# Patient Record
Sex: Male | Born: 1935 | Race: Black or African American | Hispanic: No | Marital: Married | Smoking: Never smoker
Health system: Southern US, Community
[De-identification: ages and names within clinical notes are randomized; demographics above are authoritative.]

---

## 2003-12-24 ENCOUNTER — Encounter: Payer: Self-pay | Admitting: Physical Medicine and Rehabilitation

## 2004-01-24 ENCOUNTER — Encounter: Payer: Self-pay | Admitting: Physical Medicine and Rehabilitation

## 2004-02-24 ENCOUNTER — Encounter: Payer: Self-pay | Admitting: Physical Medicine and Rehabilitation

## 2004-02-26 ENCOUNTER — Emergency Department: Payer: Self-pay | Admitting: Emergency Medicine

## 2004-03-25 ENCOUNTER — Encounter: Payer: Self-pay | Admitting: Physical Medicine and Rehabilitation

## 2004-04-11 ENCOUNTER — Emergency Department: Payer: Self-pay | Admitting: Emergency Medicine

## 2004-04-25 ENCOUNTER — Encounter: Payer: Self-pay | Admitting: Physical Medicine and Rehabilitation

## 2004-05-04 ENCOUNTER — Emergency Department: Payer: Self-pay | Admitting: General Practice

## 2004-05-23 ENCOUNTER — Encounter: Payer: Self-pay | Admitting: Physical Medicine and Rehabilitation

## 2004-06-07 ENCOUNTER — Emergency Department: Payer: Self-pay | Admitting: General Practice

## 2004-06-23 ENCOUNTER — Encounter: Payer: Self-pay | Admitting: Physical Medicine and Rehabilitation

## 2004-07-23 ENCOUNTER — Encounter: Payer: Self-pay | Admitting: Physical Medicine and Rehabilitation

## 2004-08-23 ENCOUNTER — Encounter: Payer: Self-pay | Admitting: Physical Medicine and Rehabilitation

## 2004-09-06 ENCOUNTER — Ambulatory Visit: Payer: Self-pay | Admitting: Specialist

## 2004-09-06 ENCOUNTER — Other Ambulatory Visit: Payer: Self-pay

## 2004-09-18 ENCOUNTER — Ambulatory Visit: Payer: Self-pay | Admitting: Specialist

## 2004-09-22 ENCOUNTER — Encounter: Payer: Self-pay | Admitting: Physical Medicine and Rehabilitation

## 2004-10-23 ENCOUNTER — Encounter: Payer: Self-pay | Admitting: Physical Medicine and Rehabilitation

## 2004-11-23 ENCOUNTER — Encounter: Payer: Self-pay | Admitting: Physical Medicine and Rehabilitation

## 2004-12-23 ENCOUNTER — Encounter: Payer: Self-pay | Admitting: Physical Medicine and Rehabilitation

## 2005-01-14 ENCOUNTER — Emergency Department: Payer: Self-pay | Admitting: Emergency Medicine

## 2005-01-14 ENCOUNTER — Other Ambulatory Visit: Payer: Self-pay

## 2005-01-23 ENCOUNTER — Encounter: Payer: Self-pay | Admitting: Physical Medicine and Rehabilitation

## 2005-02-15 ENCOUNTER — Emergency Department: Payer: Self-pay | Admitting: Emergency Medicine

## 2005-02-22 ENCOUNTER — Encounter: Payer: Self-pay | Admitting: Physical Medicine and Rehabilitation

## 2005-03-25 ENCOUNTER — Encounter: Payer: Self-pay | Admitting: Physical Medicine and Rehabilitation

## 2005-04-25 ENCOUNTER — Encounter: Payer: Self-pay | Admitting: Physical Medicine and Rehabilitation

## 2005-05-23 ENCOUNTER — Encounter: Payer: Self-pay | Admitting: Physical Medicine and Rehabilitation

## 2005-06-23 ENCOUNTER — Encounter: Payer: Self-pay | Admitting: Physical Medicine and Rehabilitation

## 2005-07-23 ENCOUNTER — Encounter: Payer: Self-pay | Admitting: Physical Medicine and Rehabilitation

## 2005-08-23 ENCOUNTER — Encounter: Payer: Self-pay | Admitting: Physical Medicine and Rehabilitation

## 2005-09-22 ENCOUNTER — Encounter: Payer: Self-pay | Admitting: Physical Medicine and Rehabilitation

## 2005-10-23 ENCOUNTER — Encounter: Payer: Self-pay | Admitting: Physical Medicine and Rehabilitation

## 2005-11-23 ENCOUNTER — Encounter: Payer: Self-pay | Admitting: Physical Medicine and Rehabilitation

## 2005-12-23 ENCOUNTER — Encounter: Payer: Self-pay | Admitting: Physical Medicine and Rehabilitation

## 2006-08-13 ENCOUNTER — Encounter: Payer: Self-pay | Admitting: Internal Medicine

## 2006-08-24 ENCOUNTER — Encounter: Payer: Self-pay | Admitting: Internal Medicine

## 2006-09-23 ENCOUNTER — Encounter: Payer: Self-pay | Admitting: Internal Medicine

## 2006-10-24 ENCOUNTER — Encounter: Payer: Self-pay | Admitting: Internal Medicine

## 2006-11-24 ENCOUNTER — Encounter: Payer: Self-pay | Admitting: Internal Medicine

## 2006-12-24 ENCOUNTER — Encounter: Payer: Self-pay | Admitting: Internal Medicine

## 2007-01-26 ENCOUNTER — Encounter: Payer: Self-pay | Admitting: Internal Medicine

## 2007-02-16 ENCOUNTER — Emergency Department: Payer: Self-pay | Admitting: Emergency Medicine

## 2007-02-23 ENCOUNTER — Encounter: Payer: Self-pay | Admitting: Internal Medicine

## 2007-08-12 ENCOUNTER — Ambulatory Visit: Payer: Self-pay | Admitting: Neurosurgery

## 2007-09-16 ENCOUNTER — Encounter: Payer: Self-pay | Admitting: Neurology

## 2007-09-23 ENCOUNTER — Encounter: Payer: Self-pay | Admitting: Neurology

## 2007-10-24 ENCOUNTER — Encounter: Payer: Self-pay | Admitting: Neurology

## 2008-05-25 ENCOUNTER — Ambulatory Visit: Payer: Self-pay | Admitting: Pain Medicine

## 2009-07-04 ENCOUNTER — Inpatient Hospital Stay: Payer: Self-pay | Admitting: Internal Medicine

## 2010-05-05 ENCOUNTER — Emergency Department: Payer: Self-pay | Admitting: Emergency Medicine

## 2011-10-17 ENCOUNTER — Inpatient Hospital Stay: Payer: Self-pay | Admitting: Internal Medicine

## 2011-10-17 LAB — COMPREHENSIVE METABOLIC PANEL
Albumin: 3.5 g/dL (ref 3.4–5.0)
BUN: 15 mg/dL (ref 7–18)
EGFR (Non-African Amer.): 49 — ABNORMAL LOW
Glucose: 116 mg/dL — ABNORMAL HIGH (ref 65–99)
Osmolality: 272 (ref 275–301)
SGOT(AST): 48 U/L — ABNORMAL HIGH (ref 15–37)
SGPT (ALT): 33 U/L
Total Protein: 6.8 g/dL (ref 6.4–8.2)

## 2011-10-17 LAB — CBC
HCT: 41.5 % (ref 40.0–52.0)
MCHC: 32.8 g/dL (ref 32.0–36.0)
Platelet: 100 10*3/uL — ABNORMAL LOW (ref 150–440)
RDW: 14.9 % — ABNORMAL HIGH (ref 11.5–14.5)
WBC: 4.6 10*3/uL (ref 3.8–10.6)

## 2011-10-17 LAB — URINALYSIS, COMPLETE
Glucose,UR: NEGATIVE mg/dL (ref 0–75)
Nitrite: NEGATIVE
Ph: 7 (ref 4.5–8.0)
Protein: NEGATIVE
RBC,UR: 4 /HPF (ref 0–5)
Squamous Epithelial: NONE SEEN
WBC UR: 24 /HPF (ref 0–5)

## 2011-10-17 LAB — PROTIME-INR
INR: 5.9
Prothrombin Time: 52.2 secs — ABNORMAL HIGH (ref 11.5–14.7)

## 2011-10-17 LAB — TROPONIN I: Troponin-I: 0.04 ng/mL

## 2011-10-18 DIAGNOSIS — I059 Rheumatic mitral valve disease, unspecified: Secondary | ICD-10-CM

## 2011-10-18 LAB — PROTIME-INR: Prothrombin Time: 42.2 secs — ABNORMAL HIGH (ref 11.5–14.7)

## 2011-10-18 LAB — CBC WITH DIFFERENTIAL/PLATELET
Basophil #: 0 x10 3/mm 3
Basophil %: 0.8 %
Eosinophil #: 0.2 x10 3/mm 3
Eosinophil %: 4 %
HCT: 44.8 %
HGB: 14.8 g/dL
Lymphocyte %: 20.6 %
Lymphs Abs: 0.9 x10 3/mm 3 — ABNORMAL LOW
MCH: 32.2 pg
MCHC: 33 g/dL
MCV: 97 fL
Monocyte #: 0.5 "x10 3/mm "
Monocyte %: 10.6 %
Neutrophil #: 2.8 x10 3/mm 3
Neutrophil %: 64 %
Platelet: 113 x10 3/mm 3 — ABNORMAL LOW
RBC: 4.6 x10 6/mm 3
RDW: 15.3 % — ABNORMAL HIGH
WBC: 4.4 x10 3/mm 3

## 2011-10-18 LAB — COMPREHENSIVE METABOLIC PANEL
Albumin: 3.5 g/dL (ref 3.4–5.0)
BUN: 14 mg/dL (ref 7–18)
Bilirubin,Total: 2.1 mg/dL — ABNORMAL HIGH (ref 0.2–1.0)
Calcium, Total: 9.3 mg/dL (ref 8.5–10.1)
Chloride: 102 mmol/L (ref 98–107)
EGFR (African American): >60
Glucose: 89 mg/dL (ref 65–99)
SGOT(AST): 50 U/L — ABNORMAL HIGH (ref 15–37)
SGPT (ALT): 34 U/L
Total Protein: 7 g/dL (ref 6.4–8.2)

## 2011-10-18 LAB — CK: CK, Total: 221 U/L

## 2011-10-18 LAB — LIPID PANEL
Cholesterol: 117 mg/dL
HDL Cholesterol: 51 mg/dL
Ldl Cholesterol, Calc: 54 mg/dL
Triglycerides: 61 mg/dL
VLDL Cholesterol, Calc: 12 mg/dL

## 2011-10-18 LAB — HEMOGLOBIN A1C: Hemoglobin A1C: 5 % (ref 4.2–6.3)

## 2011-10-18 LAB — TROPONIN I
Troponin-I: <0.02 ng/mL
Troponin-I: 0.04 ng/mL

## 2011-10-18 LAB — MAGNESIUM: Magnesium: 2.1 mg/dL

## 2011-10-18 LAB — CK-MB
CK-MB: 3.8 ng/mL — ABNORMAL HIGH (ref 0.5–3.6)
CK-MB: 4.1 ng/mL — ABNORMAL HIGH (ref 0.5–3.6)

## 2011-10-20 LAB — PROTIME-INR
INR: 2.8
Prothrombin Time: 29.9 secs — ABNORMAL HIGH (ref 11.5–14.7)

## 2011-10-21 LAB — PROTIME-INR
INR: 1.9
Prothrombin Time: 21.7 secs — ABNORMAL HIGH (ref 11.5–14.7)

## 2011-10-21 LAB — URINE CULTURE

## 2011-12-04 ENCOUNTER — Ambulatory Visit: Payer: Self-pay | Admitting: Ophthalmology

## 2011-12-04 DIAGNOSIS — I10 Essential (primary) hypertension: Secondary | ICD-10-CM

## 2011-12-04 LAB — PROTIME-INR
INR: 1.5
Prothrombin Time: 18.9 secs — ABNORMAL HIGH (ref 11.5–14.7)

## 2011-12-04 LAB — POTASSIUM: Potassium: 5.4 mmol/L — ABNORMAL HIGH (ref 3.5–5.1)

## 2011-12-16 ENCOUNTER — Ambulatory Visit: Payer: Self-pay | Admitting: Ophthalmology

## 2011-12-16 LAB — POTASSIUM: Potassium: 5.2 mmol/L — ABNORMAL HIGH (ref 3.5–5.1)

## 2013-09-28 ENCOUNTER — Inpatient Hospital Stay: Payer: Self-pay | Admitting: Internal Medicine

## 2013-09-28 LAB — BASIC METABOLIC PANEL
Anion Gap: 8 (ref 7–16)
BUN: 43 mg/dL — ABNORMAL HIGH (ref 7–18)
CO2: 23 mmol/L (ref 21–32)
CREATININE: 2.11 mg/dL — AB (ref 0.60–1.30)
Calcium, Total: 9.1 mg/dL (ref 8.5–10.1)
Chloride: 89 mmol/L — ABNORMAL LOW (ref 98–107)
EGFR (African American): 33 — ABNORMAL LOW
GFR CALC NON AF AMER: 28 — AB
Glucose: 103 mg/dL — ABNORMAL HIGH (ref 65–99)
OSMOLALITY: 253 (ref 275–301)
Potassium: 4.8 mmol/L (ref 3.5–5.1)
Sodium: 120 mmol/L — CL (ref 136–145)

## 2013-09-28 LAB — CBC WITH DIFFERENTIAL/PLATELET
BASOS ABS: 0 10*3/uL (ref 0.0–0.1)
BASOS PCT: 0.8 %
EOS ABS: 0.7 10*3/uL (ref 0.0–0.7)
EOS PCT: 13.2 %
HCT: 42.2 % (ref 40.0–52.0)
HGB: 13.7 g/dL (ref 13.0–18.0)
Lymphocyte #: 0.9 10*3/uL — ABNORMAL LOW (ref 1.0–3.6)
Lymphocyte %: 17.4 %
MCH: 33.4 pg (ref 26.0–34.0)
MCHC: 32.4 g/dL (ref 32.0–36.0)
MCV: 103 fL — ABNORMAL HIGH (ref 80–100)
MONOS PCT: 10.2 %
Monocyte #: 0.5 x10 3/mm (ref 0.2–1.0)
Neutrophil #: 2.9 10*3/uL (ref 1.4–6.5)
Neutrophil %: 58.4 %
Platelet: 139 10*3/uL — ABNORMAL LOW (ref 150–440)
RBC: 4.09 10*6/uL — ABNORMAL LOW (ref 4.40–5.90)
RDW: 13.2 % (ref 11.5–14.5)
WBC: 5 10*3/uL (ref 3.8–10.6)

## 2013-09-28 LAB — PROTIME-INR
INR: 2.6
Prothrombin Time: 27.2 secs — ABNORMAL HIGH (ref 11.5–14.7)

## 2013-09-28 LAB — TSH: THYROID STIMULATING HORM: 0.731 u[IU]/mL

## 2013-09-29 LAB — BASIC METABOLIC PANEL
Anion Gap: 10 (ref 7–16)
BUN: 44 mg/dL — AB (ref 7–18)
CREATININE: 1.7 mg/dL — AB (ref 0.60–1.30)
Calcium, Total: 8.6 mg/dL (ref 8.5–10.1)
Chloride: 93 mmol/L — ABNORMAL LOW (ref 98–107)
Co2: 23 mmol/L (ref 21–32)
EGFR (African American): 43 — ABNORMAL LOW
GFR CALC NON AF AMER: 37 — AB
GLUCOSE: 72 mg/dL (ref 65–99)
OSMOLALITY: 263 (ref 275–301)
POTASSIUM: 4.5 mmol/L (ref 3.5–5.1)
SODIUM: 126 mmol/L — AB (ref 136–145)

## 2013-09-29 LAB — URINALYSIS, COMPLETE
BILIRUBIN, UR: NEGATIVE
Glucose,UR: NEGATIVE mg/dL (ref 0–75)
Ketone: NEGATIVE
NITRITE: POSITIVE
Ph: 7 (ref 4.5–8.0)
Protein: 30
RBC,UR: 11 /HPF (ref 0–5)
SPECIFIC GRAVITY: 1.01 (ref 1.003–1.030)

## 2013-09-29 LAB — PROTEIN, URINE, RANDOM: Protein, Random Urine: 42 mg/dL — ABNORMAL HIGH (ref 0–12)

## 2013-09-29 LAB — SODIUM, URINE, RANDOM: Sodium, Urine Random: 15 mmol/L (ref 20–110)

## 2013-09-29 LAB — CREATININE, URINE, RANDOM: CREATININE, URINE RANDOM: 107.2 mg/dL (ref 30.0–125.0)

## 2013-09-29 LAB — OSMOLALITY, URINE: OSMOLALITY: 373 mosm/kg

## 2013-09-30 LAB — BASIC METABOLIC PANEL
Anion Gap: 9 (ref 7–16)
BUN: 40 mg/dL — AB (ref 7–18)
CO2: 25 mmol/L (ref 21–32)
Calcium, Total: 9.3 mg/dL (ref 8.5–10.1)
Chloride: 92 mmol/L — ABNORMAL LOW (ref 98–107)
Creatinine: 1.8 mg/dL — ABNORMAL HIGH (ref 0.60–1.30)
EGFR (Non-African Amer.): 35 — ABNORMAL LOW
GFR CALC AF AMER: 40 — AB
Glucose: 92 mg/dL (ref 65–99)
Osmolality: 263 (ref 275–301)
Potassium: 4.7 mmol/L (ref 3.5–5.1)
SODIUM: 126 mmol/L — AB (ref 136–145)

## 2013-09-30 LAB — PROTIME-INR
INR: 3.8
PROTHROMBIN TIME: 36.2 s — AB (ref 11.5–14.7)

## 2013-09-30 LAB — CBC WITH DIFFERENTIAL/PLATELET
Basophil #: 0 10*3/uL (ref 0.0–0.1)
Basophil %: 0.9 %
EOS ABS: 0.7 10*3/uL (ref 0.0–0.7)
EOS PCT: 16.8 %
HCT: 40.1 % (ref 40.0–52.0)
HGB: 13.2 g/dL (ref 13.0–18.0)
Lymphocyte #: 0.7 10*3/uL — ABNORMAL LOW (ref 1.0–3.6)
Lymphocyte %: 17.1 %
MCH: 34.8 pg — ABNORMAL HIGH (ref 26.0–34.0)
MCHC: 33 g/dL (ref 32.0–36.0)
MCV: 106 fL — ABNORMAL HIGH (ref 80–100)
Monocyte #: 0.5 x10 3/mm (ref 0.2–1.0)
Monocyte %: 12 %
Neutrophil #: 2.2 10*3/uL (ref 1.4–6.5)
Neutrophil %: 53.2 %
Platelet: 121 10*3/uL — ABNORMAL LOW (ref 150–440)
RBC: 3.8 10*6/uL — ABNORMAL LOW (ref 4.40–5.90)
RDW: 13.1 % (ref 11.5–14.5)
WBC: 4.2 10*3/uL (ref 3.8–10.6)

## 2013-10-01 LAB — PROTIME-INR
INR: 4.3 — AB
Prothrombin Time: 40.1 secs — ABNORMAL HIGH (ref 11.5–14.7)

## 2013-10-01 LAB — URINE CULTURE

## 2013-10-01 LAB — BASIC METABOLIC PANEL
Anion Gap: 7 (ref 7–16)
BUN: 41 mg/dL — ABNORMAL HIGH (ref 7–18)
Calcium, Total: 8.7 mg/dL (ref 8.5–10.1)
Chloride: 96 mmol/L — ABNORMAL LOW (ref 98–107)
Co2: 25 mmol/L (ref 21–32)
Creatinine: 1.49 mg/dL — ABNORMAL HIGH (ref 0.60–1.30)
GFR CALC AF AMER: 50 — AB
GFR CALC NON AF AMER: 43 — AB
Glucose: 102 mg/dL — ABNORMAL HIGH (ref 65–99)
OSMOLALITY: 267 (ref 275–301)
POTASSIUM: 4.7 mmol/L (ref 3.5–5.1)
Sodium: 128 mmol/L — ABNORMAL LOW (ref 136–145)

## 2013-10-02 LAB — BASIC METABOLIC PANEL
ANION GAP: 6 — AB (ref 7–16)
BUN: 33 mg/dL — ABNORMAL HIGH (ref 7–18)
CHLORIDE: 101 mmol/L (ref 98–107)
CO2: 26 mmol/L (ref 21–32)
Calcium, Total: 8.7 mg/dL (ref 8.5–10.1)
Creatinine: 1.33 mg/dL — ABNORMAL HIGH (ref 0.60–1.30)
GFR CALC AF AMER: 58 — AB
GFR CALC NON AF AMER: 50 — AB
GLUCOSE: 83 mg/dL (ref 65–99)
Osmolality: 273 (ref 275–301)
Potassium: 4.9 mmol/L (ref 3.5–5.1)
Sodium: 133 mmol/L — ABNORMAL LOW (ref 136–145)

## 2013-10-02 LAB — PROTIME-INR
INR: 3
Prothrombin Time: 30.3 secs — ABNORMAL HIGH (ref 11.5–14.7)

## 2013-10-03 LAB — PROTIME-INR
INR: 2.6
Prothrombin Time: 27.4 secs — ABNORMAL HIGH (ref 11.5–14.7)

## 2013-10-03 LAB — CBC WITH DIFFERENTIAL/PLATELET
BASOS ABS: 0.1 10*3/uL (ref 0.0–0.1)
BASOS PCT: 1.2 %
EOS ABS: 1.4 10*3/uL — AB (ref 0.0–0.7)
Eosinophil %: 29.3 %
HCT: 41.3 % (ref 40.0–52.0)
HGB: 13.6 g/dL (ref 13.0–18.0)
LYMPHS PCT: 18.3 %
Lymphocyte #: 0.9 10*3/uL — ABNORMAL LOW (ref 1.0–3.6)
MCH: 34.4 pg — AB (ref 26.0–34.0)
MCHC: 32.9 g/dL (ref 32.0–36.0)
MCV: 105 fL — ABNORMAL HIGH (ref 80–100)
Monocyte #: 0.5 x10 3/mm (ref 0.2–1.0)
Monocyte %: 11.2 %
NEUTROS ABS: 1.9 10*3/uL (ref 1.4–6.5)
NEUTROS PCT: 40 %
Platelet: 135 10*3/uL — ABNORMAL LOW (ref 150–440)
RBC: 3.95 10*6/uL — ABNORMAL LOW (ref 4.40–5.90)
RDW: 13.2 % (ref 11.5–14.5)
WBC: 4.8 10*3/uL (ref 3.8–10.6)

## 2013-10-03 LAB — BASIC METABOLIC PANEL
Anion Gap: 5 — ABNORMAL LOW (ref 7–16)
BUN: 22 mg/dL — AB (ref 7–18)
CHLORIDE: 104 mmol/L (ref 98–107)
CREATININE: 1.13 mg/dL (ref 0.60–1.30)
Calcium, Total: 8.7 mg/dL (ref 8.5–10.1)
Co2: 27 mmol/L (ref 21–32)
EGFR (Non-African Amer.): >60
Glucose: 83 mg/dL (ref 65–99)
Osmolality: 274 (ref 275–301)
POTASSIUM: 5.1 mmol/L (ref 3.5–5.1)
Sodium: 136 mmol/L (ref 136–145)

## 2013-10-04 LAB — PROTIME-INR
INR: 2.4
Prothrombin Time: 25.2 secs — ABNORMAL HIGH (ref 11.5–14.7)

## 2013-10-05 LAB — CULTURE, BLOOD (SINGLE)

## 2013-10-05 LAB — PROTIME-INR
INR: 2.6
PROTHROMBIN TIME: 27.5 s — AB (ref 11.5–14.7)

## 2013-10-05 LAB — BETA-HYDROXYBUTYRIC ACID: BETA-HYDROXYBUTYRATE: 1.12 mg/dL (ref 0.2–2.8)

## 2013-10-05 LAB — GLUCOSE, RANDOM: GLUCOSE: 139 mg/dL — AB (ref 65–99)

## 2013-10-06 LAB — PROTIME-INR
INR: 2.9
Prothrombin Time: 29.8 secs — ABNORMAL HIGH (ref 11.5–14.7)

## 2013-10-06 LAB — GLUCOSE, RANDOM: GLUCOSE: 127 mg/dL — AB (ref 65–99)

## 2013-10-06 LAB — BETA-HYDROXYBUTYRIC ACID: BETA-HYDROXYBUTYRATE: 1.13 mg/dL (ref 0.2–2.8)

## 2014-01-02 ENCOUNTER — Inpatient Hospital Stay: Payer: Self-pay | Admitting: Internal Medicine

## 2014-01-02 LAB — URINALYSIS, COMPLETE
Bilirubin,UR: NEGATIVE
Glucose,UR: NEGATIVE mg/dL (ref 0–75)
NITRITE: NEGATIVE
Ph: 5 (ref 4.5–8.0)
RBC,UR: 20 /HPF (ref 0–5)
Specific Gravity: 1.019 (ref 1.003–1.030)
Squamous Epithelial: <1
WBC UR: 58 /HPF (ref 0–5)

## 2014-01-02 LAB — PRO B NATRIURETIC PEPTIDE: B-Type Natriuretic Peptide: 5561 pg/mL — ABNORMAL HIGH (ref 0–450)

## 2014-01-02 LAB — CBC
HCT: 45.8 % (ref 40.0–52.0)
HGB: 14.9 g/dL (ref 13.0–18.0)
MCH: 32.8 pg (ref 26.0–34.0)
MCHC: 32.5 g/dL (ref 32.0–36.0)
MCV: 101 fL — AB (ref 80–100)
PLATELETS: 84 10*3/uL — AB (ref 150–440)
RBC: 4.54 10*6/uL (ref 4.40–5.90)
RDW: 12.9 % (ref 11.5–14.5)
WBC: 3.7 10*3/uL — ABNORMAL LOW (ref 3.8–10.6)

## 2014-01-02 LAB — BASIC METABOLIC PANEL
BUN: 15 mg/dL (ref 7–18)
CHLORIDE: 83 mmol/L — AB (ref 98–107)
Calcium, Total: 9.1 mg/dL (ref 8.5–10.1)
Co2: 25 mmol/L (ref 21–32)
Creatinine: 1.17 mg/dL (ref 0.60–1.30)
EGFR (African American): >60
EGFR (Non-African Amer.): >60
Glucose: 93 mg/dL (ref 65–99)
POTASSIUM: 4.2 mmol/L (ref 3.5–5.1)
Sodium: 119 mmol/L — CL (ref 136–145)

## 2014-01-02 LAB — PROTIME-INR
INR: 3.1
PROTHROMBIN TIME: 31 s — AB (ref 11.5–14.7)

## 2014-01-02 LAB — TROPONIN I: TROPONIN-I: 0.05 ng/mL

## 2014-01-03 LAB — MAGNESIUM: Magnesium: 1.7 mg/dL — ABNORMAL LOW

## 2014-01-03 LAB — BASIC METABOLIC PANEL
ANION GAP: 10 (ref 7–16)
Anion Gap: 11 (ref 7–16)
BUN: 15 mg/dL (ref 7–18)
BUN: 16 mg/dL (ref 7–18)
CHLORIDE: 86 mmol/L — AB (ref 98–107)
CHLORIDE: 88 mmol/L — AB (ref 98–107)
CREATININE: 1 mg/dL (ref 0.60–1.30)
Calcium, Total: 8.8 mg/dL (ref 8.5–10.1)
Calcium, Total: 8.6 mg/dL (ref 8.5–10.1)
Co2: 25 mmol/L (ref 21–32)
Co2: 24 mmol/L (ref 21–32)
Creatinine: 1.06 mg/dL (ref 0.60–1.30)
EGFR (African American): >60
EGFR (Non-African Amer.): >60
EGFR (Non-African Amer.): >60
GLUCOSE: 96 mg/dL (ref 65–99)
GLUCOSE: 100 mg/dL — AB (ref 65–99)
OSMOLALITY: 245 (ref 275–301)
Osmolality: 249 (ref 275–301)
POTASSIUM: 3.7 mmol/L (ref 3.5–5.1)
POTASSIUM: 3.8 mmol/L (ref 3.5–5.1)
SODIUM: 121 mmol/L — AB (ref 136–145)
SODIUM: 123 mmol/L — AB (ref 136–145)

## 2014-01-03 LAB — PROTIME-INR
INR: 3.1
Prothrombin Time: 30.9 secs — ABNORMAL HIGH (ref 11.5–14.7)

## 2014-01-03 LAB — HEPATIC FUNCTION PANEL A (ARMC)
ALK PHOS: 106 U/L
Albumin: 3.4 g/dL (ref 3.4–5.0)
Bilirubin, Direct: 0.7 mg/dL — ABNORMAL HIGH (ref 0.00–0.20)
Bilirubin,Total: 1.5 mg/dL — ABNORMAL HIGH (ref 0.2–1.0)
SGOT(AST): 47 U/L — ABNORMAL HIGH (ref 15–37)
SGPT (ALT): 33 U/L
Total Protein: 6.3 g/dL — ABNORMAL LOW (ref 6.4–8.2)

## 2014-01-03 LAB — CK-MB
CK-MB: 10.2 ng/mL — AB (ref 0.5–3.6)
CK-MB: 10.6 ng/mL — ABNORMAL HIGH (ref 0.5–3.6)
CK-MB: 10.8 ng/mL — ABNORMAL HIGH (ref 0.5–3.6)
CK-MB: 12.1 ng/mL — AB (ref 0.5–3.6)

## 2014-01-03 LAB — TROPONIN I
TROPONIN-I: 0.04 ng/mL
Troponin-I: 0.04 ng/mL

## 2014-01-03 LAB — AMMONIA: AMMONIA, PLASMA: 36 umol/L — AB (ref 11–32)

## 2014-01-03 LAB — CK: CK, Total: 292 U/L

## 2014-01-04 LAB — BASIC METABOLIC PANEL
ANION GAP: 10 (ref 7–16)
Anion Gap: 9 (ref 7–16)
BUN: 16 mg/dL (ref 7–18)
BUN: 17 mg/dL (ref 7–18)
CREATININE: 1.2 mg/dL (ref 0.60–1.30)
Calcium, Total: 9 mg/dL (ref 8.5–10.1)
Calcium, Total: 8.7 mg/dL (ref 8.5–10.1)
Chloride: 87 mmol/L — ABNORMAL LOW (ref 98–107)
Chloride: 89 mmol/L — ABNORMAL LOW (ref 98–107)
Co2: 28 mmol/L (ref 21–32)
Co2: 28 mmol/L (ref 21–32)
Creatinine: 1.13 mg/dL (ref 0.60–1.30)
EGFR (Non-African Amer.): >60
EGFR (Non-African Amer.): >60
Glucose: 102 mg/dL — ABNORMAL HIGH (ref 65–99)
Glucose: 130 mg/dL — ABNORMAL HIGH (ref 65–99)
Osmolality: 253 (ref 275–301)
Osmolality: 257 (ref 275–301)
Potassium: 4.1 mmol/L (ref 3.5–5.1)
Potassium: 3.8 mmol/L (ref 3.5–5.1)
Sodium: 125 mmol/L — ABNORMAL LOW (ref 136–145)
Sodium: 126 mmol/L — ABNORMAL LOW (ref 136–145)

## 2014-01-04 LAB — CBC WITH DIFFERENTIAL/PLATELET
BASOS ABS: 0 10*3/uL (ref 0.0–0.1)
Basophil %: 0.6 %
Eosinophil #: 0.1 10*3/uL (ref 0.0–0.7)
Eosinophil %: 3.5 %
HCT: 45.5 % (ref 40.0–52.0)
HGB: 14.6 g/dL (ref 13.0–18.0)
LYMPHS PCT: 13 %
Lymphocyte #: 0.4 10*3/uL — ABNORMAL LOW (ref 1.0–3.6)
MCH: 32.4 pg (ref 26.0–34.0)
MCHC: 32.1 g/dL (ref 32.0–36.0)
MCV: 101 fL — ABNORMAL HIGH (ref 80–100)
Monocyte #: 0.5 x10 3/mm (ref 0.2–1.0)
Monocyte %: 15.3 %
Neutrophil #: 2.3 10*3/uL (ref 1.4–6.5)
Neutrophil %: 67.6 %
Platelet: 86 10*3/uL — ABNORMAL LOW (ref 150–440)
RBC: 4.51 10*6/uL (ref 4.40–5.90)
RDW: 13.4 % (ref 11.5–14.5)
WBC: 3.4 10*3/uL — ABNORMAL LOW (ref 3.8–10.6)

## 2014-01-04 LAB — PROTIME-INR
INR: 3
PROTHROMBIN TIME: 30.3 s — AB (ref 11.5–14.7)

## 2014-01-05 LAB — BASIC METABOLIC PANEL
Anion Gap: 8 (ref 7–16)
BUN: 19 mg/dL — ABNORMAL HIGH (ref 7–18)
CO2: 30 mmol/L (ref 21–32)
Calcium, Total: 8.9 mg/dL (ref 8.5–10.1)
Chloride: 86 mmol/L — ABNORMAL LOW (ref 98–107)
Creatinine: 1.14 mg/dL (ref 0.60–1.30)
EGFR (African American): >60
EGFR (Non-African Amer.): >60
GLUCOSE: 85 mg/dL (ref 65–99)
OSMOLALITY: 251 (ref 275–301)
Potassium: 4.4 mmol/L (ref 3.5–5.1)
Sodium: 124 mmol/L — ABNORMAL LOW (ref 136–145)

## 2014-01-05 LAB — CBC WITH DIFFERENTIAL/PLATELET
BASOS PCT: 0.4 %
Basophil #: 0 10*3/uL (ref 0.0–0.1)
Eosinophil #: 0.2 10*3/uL (ref 0.0–0.7)
Eosinophil %: 4.6 %
HCT: 44.1 % (ref 40.0–52.0)
HGB: 14.5 g/dL (ref 13.0–18.0)
LYMPHS PCT: 12.3 %
Lymphocyte #: 0.6 10*3/uL — ABNORMAL LOW (ref 1.0–3.6)
MCH: 33.4 pg (ref 26.0–34.0)
MCHC: 33 g/dL (ref 32.0–36.0)
MCV: 101 fL — ABNORMAL HIGH (ref 80–100)
Monocyte #: 0.7 x10 3/mm (ref 0.2–1.0)
Monocyte %: 14.1 %
NEUTROS PCT: 68.6 %
Neutrophil #: 3.3 10*3/uL (ref 1.4–6.5)
PLATELETS: 89 10*3/uL — AB (ref 150–440)
RBC: 4.35 10*6/uL — ABNORMAL LOW (ref 4.40–5.90)
RDW: 13.3 % (ref 11.5–14.5)
WBC: 4.8 10*3/uL (ref 3.8–10.6)

## 2014-01-05 LAB — MAGNESIUM: Magnesium: 2 mg/dL

## 2014-01-05 LAB — SODIUM: Sodium: 126 mmol/L — ABNORMAL LOW (ref 136–145)

## 2014-01-06 LAB — URINE CULTURE

## 2014-07-12 NOTE — Op Note (Signed)
PATIENT NAME:  Carl Molina, [name] MR#:  [id number] DATE OF BIRTH:  08/12/1929  DATE OF PROCEDURE:  12/16/2011  PREOPERATIVE DIAGNOSIS:  Senile cataract left eye.  POSTOPERATIVE DIAGNOSIS:  Senile cataract left eye.  PROCEDURE:  Phacoemulsification with posterior chamber intraocular lens implantation of the left eye.  LENS: ZCB00 15.5-diopter posterior chamber intraocular lens.  ULTRASOUND TIME:  12% of 1 minute, 24 seconds for CDE 9.7.  SURGEON:  [place]had Korena Nass, MD  ANESTHESIA:  Topical with tetracaine drops and 2% Xylocaine jelly.  COMPLICATIONS:  None.  DESCRIPTION OF PROCEDURE:  The patient was identified in the holding room and transported to the operating room and placed in the supine position under the operating microscope.  The left eye was identified as the operative eye and it was prepped and draped in the usual sterile ophthalmic fashion.  A 1 millimeter clear-corneal paracentesis was made at the 1:30 position.  The anterior chamber was filled with Viscoat viscoelastic.  A 2.4 millimeter keratome was used to make a near-clear corneal incision at the 10:30 position.  A curvilinear capsulorrhexis was made with a cystotome and capsulorrhexis forceps.  Balanced salt solution was used to hydrodissect and hydrodelineate the nucleus.  Phacoemulsification was then used in horizontal chopping fashion to remove the lens nucleus and epinucleus.  The remaining cortex was then removed using the irrigation and aspiration handpiece. Provisc was then placed into the capsular bag to distend it for lens placement.  A ZCB00 15.5-diopter lens was then injected into the capsular bag.  The remaining viscoelastic was aspirated.  Wounds were hydrated with balanced salt solution.  The anterior chamber was inflated to a physiologic pressure with balanced salt solution.  Miostat was placed into the anterior chamber to constrict the pupil. No wound leaks were noted.  Topical Vigamox drops and Maxitrol ointment  were applied to the eye. The patient was taken to the recovery room in stable condition without complications of anesthesia or surgery. ____________________________ Deirdre Evenerhadwick R. Linken Mcglothen, MD crb:slb D: 12/16/2011 12:36:16 ET T: 12/16/2011 13:10:04 ET JOB#: [id number]  cc: Deirdre Evenerhadwick R. Konstantinos Cordoba, MD, <Dictator> Lockie MolaHADWICK Laterica Matarazzo MD ELECTRONICALLY SIGNED 12/16/2011 13:22

## 2014-07-12 NOTE — Consult Note (Signed)
Chief Complaint:   Subjective/Chief Complaint Pt states to be doing well. He denies cp no sob . Brady persisit.   VITAL SIGNS/ANCILLARY NOTES: **Vital Signs.:   27-Jul-13 10:04   Temperature Temperature (F) 97.8   Celsius 36.5   Temperature Source Oral   Pulse Pulse 61   Respirations Respirations 18   Systolic BP Systolic BP 696   Diastolic BP (mmHg) Diastolic BP (mmHg) 93   Mean BP 119   Pulse Ox % Pulse Ox % 97   Pulse Ox Activity Level  At rest   Oxygen Delivery Room Air/ 21 %  *Intake and Output.:   27-Jul-13 10:01   Grand Totals Intake:   Output:  1200    Net:  -1200 24 Hr.:  -2200   Urine ml     Out:  1200   Urinary Method  Foley   Brief Assessment:   Cardiac Regular  murmur present  -- carotid bruits  -- LE edema  -- JVD  --Gallop    Respiratory normal resp effort  clear BS  rhonchi    Gastrointestinal Normal    Gastrointestinal details normal Soft   Lab Results: Hepatic:  26-Jul-13 01:58    Bilirubin, Total  2.1   Alkaline Phosphatase  138   SGPT (ALT) 34 (12-78 NOTE: NEW REFERENCE RANGE 02/15/2011)   SGOT (AST)  50   Total Protein, Serum 7.0   Albumin, Serum 3.5  Cardiology:  26-Jul-13 07:55    Echo Doppler  Interpretation Summary   The study was technically difficult with many images being suboptimal  in quality. Left ventricular systolic function is moderately  reduced. Ejection Fraction = 35-45%. There is mild concentric left  ventricular hypertrophy. There is moderate inferior /inferoseptal  wall hypokinesis. The right ventricle is mildly dilated. The right  ventricular systolic function is moderately reduced. The left atrium  is moderately dilated. There is mild mitral regurgitation. There is  mild tricuspid regurgitation. Right ventricular systolic pressure is  elevated at 30-9mHg.  Procedure:   A two-dimensional transthoracic echocardiogram with color flow and  Doppler was performed.   The study was technically difficult with many  images being suboptimal  in quality.  Left Ventricle   The left ventricle is normal in size.   There is mild concentric left ventricular hypertrophy.   Left ventricular systolic function is moderately reduced.   Ejection Fraction = 35-45%.   There is moderate septal hypokinesis.   There is moderate inferior wall hypokinesis.  Right Ventricle   The right ventricle is mildly dilated.   There is normal right ventricular wall thickness.   The right ventricular systolic functionis moderately reduced.  Atria   The left atrium is moderately dilated.   The right atrium is moderately dilated.  Mitral Valve   The mitral valve is grossly normal.   No significant mitral valve stenosis.   There is mild mitral regurgitation.  Tricuspid Valve   The tricuspid valve is not well visualized, but is grossly normal.   No significant tricuspid stenosis.   There is mild tricuspid regurgitation.   Right ventricular systolic pressure is elevated at 30-427mg.  Aortic Valve   The aortic valve opens well.   No hemodynamically significant valvular aortic stenosis.   No aortic regurgitation is present.  Pulmonic Valve   The pulmonic valve is not well seen, but is grossly normal.   Trace pulmonic valvular regurgitation.  Great Vessels   The aortic root is normal size.  Pericardium/Pleural   No  pericardial effusion.  MMode 2D Measurements and Calculations   RVDd: 5.1 cm   IVSd: 1.8 cm   LVIDd: 5.0 cm   LVIDs: 4.1 cm   LVPWd: 1.2 cm   FS: 17 %   EF(Teich): 35 %   Ao root diam: 3.4 cm   LA dimension: 5.1 cm   LVOT diam: 2.0 cm  Doppler Measurements and Calculations   MV E point: 79 cm/sec   MV dec time: 0.25 sec   Ao V2 max: 114 cm/sec   Ao max PG: 5.0 mmHg   AVA(V,D): 2.1 cm2   LV max PG: 2.0 mmHg   LV V1 max: 76 cm/sec   PA V2 max: 88 cm/sec   PA max PG: 3.0 mmHg   TR Max vel: 260 cm/sec   TR Max PG: 27 mmHg   RVSP: 32 mmHg   RAP systole: 5.0 mmHg  Reading Physician: Kathlyn Sacramento  Sonographer: Sherrie Sport Interpreting Physician:  Kathlyn Sacramento,  electronically signed on  10-18-2011 09:24:12 Requesting Physician: Kathlyn Sacramento  Routine Chem:  26-Jul-13 01:58    Magnesium, Serum  1.5 (1.8-2.4 THERAPEUTIC RANGE: 4-7 mg/dL TOXIC: > 10 mg/dL  -----------------------)   Glucose, Serum 89   BUN 14   Creatinine (comp) 1.28   Sodium, Serum 139   Potassium, Serum 4.1   Chloride, Serum 102   CO2, Serum 28   Calcium (Total), Serum 9.3   Osmolality (calc) 277   eGFR (African American) >60   eGFR (Non-African American)  53 (eGFR values <37m/min/1.73 m2 may be an indication of chronic kidney disease (CKD). Calculated eGFR is useful in patients with stable renal function. The eGFR calculation will not be reliable in acutely ill patients when serum creatinine is changing rapidly. It is not useful in  patients on dialysis. The eGFR calculation may not be applicable to patients at the low and high extremes of body sizes, pregnant women, and vegetarians.)   Anion Gap 9   Hemoglobin A1c ([facility]) 5.0 (The American Diabetes Association recommends that a primary goal of therapy should be <7% and that physicians should reevaluate the treatment regimen in patients with HbA1c values consistently >8%.)   Cholesterol, Serum 117   Triglycerides, Serum 61   HDL (INHOUSE) 51   VLDL Cholesterol Calculated 12   LDL Cholesterol Calculated 54 (Result(s) reported on 18 Oct 2011 at 02:52AM.)    12:12    Result Comment INR - RESULTS VERIFIED BY REPEAT TESTING.  - NOTIFIED OF CRITICAL VALUE  - sbp to Loraine Grumieaux 1334 10-18-11  - READ-BACK PROCESS PERFORMED.  Result(s) reported on 18 Oct 2011 at 01:37PM.   Magnesium, Serum 2.1 (1.8-2.4 THERAPEUTIC RANGE: 4-7 mg/dL TOXIC: > 10 mg/dL  -----------------------)  Cardiac:  26-Jul-13 01:58    Troponin I 0.04 (0.00-0.05 0.05 ng/mL or less: NEGATIVE  Repeat testing in 3-6 hrs  if clinically indicated. >0.05 ng/mL:  POTENTIAL  MYOCARDIAL INJURY. Repeat  testing in 3-6 hrs if  clinically indicated. NOTE: An increase or decrease  of 30% or more on serial  testing suggests a  clinically important change)   CK, Total 221 (Result(s) reported on 18 Oct 2011 at 02:41AM.)    10:35    CPK-MB, Serum  3.8 (Result(s) reported on 18 Oct 2011 at 10:59AM.)   Troponin I < 0.02 (0.00-0.05 0.05 ng/mL or less: NEGATIVE  Repeat testing in 3-6 hrs  if clinically indicated. >0.05 ng/mL: POTENTIAL  MYOCARDIAL INJURY. Repeat  testing in 3-6 hrs if  clinically  indicated. NOTE: An increase or decrease  of 30% or more on serial  testing suggests a  clinically important change)    19:29    CPK-MB, Serum  4.1 (Result(s) reported on 18 Oct 2011 at 07:57PM.)   Troponin I 0.04 (0.00-0.05 0.05 ng/mL or less: NEGATIVE  Repeat testing in 3-6 hrs  if clinically indicated. >0.05 ng/mL: POTENTIAL  MYOCARDIAL INJURY. Repeat  testing in 3-6 hrs if  clinically indicated. NOTE: An increase or decrease  of 30% or more on serial  testing suggests a  clinically important change)  Routine Coag:  26-Jul-13 12:12    Prothrombin  42.2   INR  4.5 (INR reference interval applies to patients on anticoagulant therapy. A single INR therapeutic range for coumarins is not optimal for all indications; however, the suggested range for most indications is 2.0 - 3.0. Exceptions to the INR Reference Range may include: Prosthetic heart valves, acute myocardial infarction, prevention of myocardial infarction, and combinations of aspirin and anticoagulant. The need for a higher or lower target INR must be assessed individually. Reference: The Pharmacology and Management of the Vitamin K  antagonists: the seventh ACCP Conference on Antithrombotic and Thrombolytic Therapy. CHENI.7782 Sept:126 (3suppl): [id number]. A HCT value >55% may artifactually increase the PT.  In one study,  the increase was an average of 25%. Reference:  "Effect on  Routine and Special Coagulation Testing Values of Citrate Anticoagulant Adjustment in Patients with High HCT Values." American Journal of Clinical Pathology 2006;126:400-405.)  Routine Hem:  26-Jul-13 01:58    WBC (CBC) 4.4   RBC (CBC) 4.60   Hemoglobin (CBC) 14.8   Hematocrit (CBC) 44.8   Platelet Count (CBC)  113   MCV 97   MCH 32.2   MCHC 33.0   RDW  15.3   Neutrophil % 64.0   Lymphocyte % 20.6   Monocyte % 10.6   Eosinophil % 4.0   Basophil % 0.8   Neutrophil # 2.8   Lymphocyte #  0.9   Monocyte # 0.5   Eosinophil # 0.2   Basophil # 0.0 (Result(s) reported on 18 Oct 2011 at 02:34AM.)   Radiology Results: XRay:    25-Jul-13 16:02, Chest PA and Lateral   Chest PA and Lateral    REASON FOR EXAM:    CVA  COMMENTS:   May transport without cardiac monitor    PROCEDURE: DXR - DXR CHEST PA (OR AP) AND LATERAL  - Oct 17 2011  4:02PM     RESULT: Comparison is made to the previous exam dated July 06, 2009.    The cardiac silhouetteis enlarged. The lungs show mild hyperinflation   without effusion, edema or infiltrate. The bony and mediastinal   structures appear unremarkable.    IMPRESSION:     Stable cardiomegaly.    Thank you for the opportunity to contribute to the care of your patient.     Dictation Site: 2          Verified By: Sundra Aland, M.D., MD    26-Jul-13 08:25, Chest PA and Lateral   Chest PA and Lateral    REASON FOR EXAM:    chf  COMMENTS:       PROCEDURE: DXR - DXR CHEST PA (OR AP) AND LATERAL  - Oct 18 2011  8:25AM     RESULT: Comparison is made to the study of October 17, 2011.    The heart is enlarged but unchanged in size. There is basilar   atelectasis.  There is no overt edema, large effusion or pneumothorax.   Degenerative changes are seen in the spine.    IMPRESSION:   1. COPD.  2. Stable cardiomegaly.    Thank you for the opportunity to contribute to the care of your patient.     Dictation Site: 2          Verified By:  Sundra Aland, M.D., MD  [place]:    25-Jul-13 23:10, US Carotid Doppler Bilateral   US Carotid Doppler Bilateral    REASON FOR EXAM:    cva  COMMENTS:       PROCEDURE: [place]  - US CAROTID DOPPLER BILATERAL  - Oct 17 2011 11:10PM     RESULT:  Grayscale, duplex, color flow and SPECTRAL waveform imaging was   performed of the right and left carotid systems.    Findings: Visual evaluation of right and left carotid system   demonstrates mixed soft and calcified plaque within the carotid bulbs   demonstrating less than 50% stenosis.    Unremarkable color filling and SPECTRAL waveform imaging is identified   within theright and left carotid systems.    ICA/CCA ratios:  Right 1.2 Left 0.82.    Antegrade flow is identified within the right and left vertebral arteries.    IMPRESSION:      No sonographic evidence of hemodynamically significant stenosis within   the right or left carotid systems.    These findings were relayed via a preliminary faxed report.    Thank you for the opportunity to contribute to the care of your patient.           Verified By: Mikki Santee, M.D., MD  MRI:    26-Jul-13 09:37, MRI Brain Without Contrast   MRI Brain Without Contrast    REASON FOR EXAM:    cva  COMMENTS:       PROCEDURE: MR  - MR BRAIN WO CONTRAST  - Oct 18 2011  9:37AM     RESULT: Multiplanar images were obtained through the brain without   administration of gadolinium.    The diffusion-weighted sequences exhibit no findings suspicious for acute   ischemia. The inversion recovery-FLAIR sequences reveal moderate amounts   of increased periventricular white matter signal and more confluent   increased signal in the right parietal lobe. The standard T1 and T2   weighted images reveal the ventricles to be normal in size and position.   There is encephalomalacia in the right high posterior frontal-anterior   parietal lobe within the area of abnormality demonstrated on the FLAIR      sequence. There are no findings to suggest an acute intracranial   hemorrhage. The cerebellum and brainstem exhibit normal signal intensity   and position. 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 Cranial nerves VII and VIII   appear normal at the level of the cerebellopontine angles. The observed   portions of the paranasal sinuses and mastoid air cells appear well   pneumatized.    IMPRESSION:   1. There is no evidence of an acute ischemic infarction.  2. There is encephalomalacia in the high posterior frontal-anterior right   parietal lobe which corresponds to the area of hypodensity on the   patient's CT scan of 25 July. There is no evidence of acute intracranial   hemorrhage.  3. There is no hydrocephalus.      Verified By: DAVID A. [place], M.D., MD  Cardiology:    25-Jul-13 15:35, ED ECG   Ventricular Rate  47   Atrial Rate 258   QRS Duration 104   QT 472   QTc 417   R Axis -24   T Axis 98   ECG interpretation    Atrial fibrillation with slow ventricular response  Minimal voltage criteria for LVH, may be normal variant  Inferior infarct (cited on or before 06-Sep-2004)  Anterior infarct (cited on or before 04-Jul-2009)  Abnormal ECG  When compared with ECG of11-FEB-2012 20:10,  Atrial fibrillation has replaced Sinus rhythm  Vent. rate has decreased BY  29 BPM  Nonspecific T wave abnormality has replaced inverted T waves in Inferior leads  ----------unconfirmed----------  Confirmed by OVERREAD, NOT (100),editor PEARSON, BARBARA ([id number]) on 10/18/2011 8:37:30 AM   ED ECG     25-Jul-13 17:29, ECG   Ventricular Rate 50   Atrial Rate 52   QRS Duration 104   QT 408   QTc 371   R Axis -25   T Axis 111   ECG interpretation    Sinus bradycardia with A-V dissociation and Junctional rhythm with Sinus/atrial capture  Minimal voltage criteria for LVH, may be normal variant  Inferior infarct (cited on or before 06-Sep-2004)  Anterior infarct (cited on or before 04-Jul-2009)  *** **  ** ** * ACUTE MI  ** ** ** **  Abnormal ECG  When compared with ECG of 17-Oct-2011 15:35,  Junctional rhythm has replaced Atrial fibrillation  Serial changes of Inferior infarct Present  ----------unconfirmed----------  Confirmed by OVERREAD, NOT(100), editor PEARSON, BARBARA ([id number]) on 10/18/2011 8:38:04 AM   ECG     26-Jul-13 07:55, Echo Doppler   Echo Doppler    Interpretation Summary    The study was technically difficult with many images being suboptimal   in quality. Left ventricular systolic function is moderately   reduced. Ejection Fraction = 35-45%. There is mild concentric left   ventricular hypertrophy. There is moderate inferior /inferoseptal   wall hypokinesis. The right ventricle is mildly dilated. The right   ventricular systolic function is moderately reduced. The left atrium   is moderately dilated. There is mild mitral regurgitation. There is   mild tricuspid regurgitation. Right ventricular systolic pressure is   elevated at 30-22mHg.    Procedure:    A two-dimensional transthoracic echocardiogram with color flow and   Doppler was performed.    The study was technically difficult with many images being suboptimal   in quality.    Left Ventricle    The left ventricle is normal in size.    There is mild concentric left ventricular hypertrophy.    Left ventricular systolic function is moderately reduced.    Ejection Fraction = 35-45%.    There is moderate septal hypokinesis.    There is moderate inferior wall hypokinesis.    Right Ventricle    The right ventricle is mildly dilated.    There is normal right ventricular wall thickness.    The right ventricular systolic functionis moderately reduced.    Atria    The left atrium is moderately dilated.    The right atrium is moderately dilated.    Mitral Valve    The mitral valve is grossly normal.    No significant mitral valve stenosis.    There is mild mitral regurgitation.    Tricuspid Valve    The  tricuspid valve is not well visualized, but is grossly normal.    No significant tricuspid stenosis.    There is mild tricuspid regurgitation.    Right ventricular  systolic pressure is elevated at 30-28mHg.    Aortic Valve    The aortic valve opens well.    No hemodynamically significant valvular aortic stenosis.    No aortic regurgitation is present.    Pulmonic Valve    The pulmonic valve is not well seen, but is grossly normal.    Trace pulmonic valvular regurgitation.    Great Vessels    The aortic root is normal size.    Pericardium/Pleural    No pericardial effusion.    MMode 2D Measurements and Calculations    RVDd: 5.1 cm    IVSd: 1.8 cm    LVIDd: 5.0 cm    LVIDs: 4.1 cm    LVPWd: 1.2 cm    FS: 17 %    EF(Teich): 35 %    Ao root diam: 3.4 cm    LA dimension: 5.1 cm    LVOT diam: 2.0 cm    Doppler Measurements and Calculations    MV E point: 79 cm/sec    MV dec time: 0.25 sec    Ao V2 max: 114 cm/sec    Ao max PG: 5.0 mmHg    AVA(V,D): 2.1 cm2    LV max PG: 2.0 mmHg    LV V1 max: 76 cm/sec    PA V2 max: 88 cm/sec    PA max PG: 3.0 mmHg    TR Max vel: 260 cm/sec    TR Max PG: 27 mmHg    RVSP: 32 mmHg    RAP systole: 5.0 mmHg    Reading Physician: AKathlyn Sacramento  Sonographer: HSherrie Sport Interpreting Physician:  MKathlyn Sacramento  electronically signed on   10-18-2011 09:24:12  Requesting Physician: AKathlyn Sacramento CT:    25-Jul-13 15:52, CT Head Without Contrast   CT Head Without Contrast    REASON FOR EXAM:    CVA  COMMENTS:   May transport without cardiac monitor    PROCEDURE: CT  - CT HEAD WITHOUT CONTRAST  - Oct 17 2011  3:52PM     RESULT: Noncontrast CT of the brain is compared to the study of 05 May 2010.    There is a megacisterna magna present. There is evidence of diffuse   atrophy. Low-attenuation is present within the periventricular and   subcortical white matter regions. There is again mild encephalomalacia in   the right  frontoparietal region. Old lacunar infarctis noted in the   basal ganglia. There is no intracranial hemorrhage, mass or mass effect.   There is no midline shift or interval infarct. The sinuses and mastoid   air cells demonstrate normal appearing aeration. The calvarium is intact.  IMPRESSION:   1. Stable CT of the brain with evidence of atrophy, chronic small vessel   ischemic disease and old right frontoparietal small area of   encephalomalacia. Basal ganglia lacunar infarcts are present.    Dictation Site: 1          Verified By: GSundra Aland M.D., MD   Assessment/Plan:  Invasive Device Daily Assessment of Necessity:   Does the patient currently have any of the following indwelling devices? foley    Indwelling Urinary Catheter continued, requirement due to    Reason to continue Indwelling Urinary Catheter long term catheterization has already been initiated   Assessment/Plan:   Assessment IMP Bradycaria-stable , continue to hold lopressor. Consider holding cloinidine AFIB stable, rate controlled. Continue anticoug Obeisty Neurogenic bladder AMS HTN Paraplegic Cardiomyopathy    Plan  PLAN Hold lopressor Continue aniticoug F/U cultures for possible sepsis Agree with foley Consider antibx I do not rec PPM at this point Conservative medical therapy for now   Electronic Signatures: Yolonda Kida (MD)  (Signed 27-Jul-13 13:06)  Authored: Chief Complaint, VITAL SIGNS/ANCILLARY NOTES, Brief Assessment, Lab Results, Radiology Results, Assessment/Plan   Last Updated: 27-Jul-13 13:06 by Lujean Amel D (MD)

## 2014-07-12 NOTE — H&P (Signed)
PATIENT NAME:  Carl Molina, Carl Molina MR#:  [id number] DATE OF BIRTH:  1930-02-19  DATE OF ADMISSION:  10/17/2011  PRIMARY CARE PHYSICIAN: Alonna Buckler, MD   CHIEF COMPLAINT: Bradycardia and altered mental status.   HISTORY OF PRESENT ILLNESS: This is a 81 year old African American male patient with history of atrial fibrillation on anticoagulation, coronary artery disease, paraplegia and neurogenic bladder with an indwelling suprapubic catheter who presents to the Emergency Room sent in from [facility] with bradycardia of 40's. The patient in the Emergency Room had EKG showing bradycardia at 47, junctional rhythm, along with some altered mental status as per daughter at bedside. The hospitalist service has been counseled to admit the patient for further management.   The patient presently wakes up on calling his name. He is not oriented to place or time but is oriented to person. Daughter at bedside mentions that she has traveled from [place] today, does talk to the patient over the phone regularly, and his speech was significantly better than today a week prior. The patient has also had episodes of confusion. At baseline the patient ambulates in an electric wheelchair. He is mostly bedbound. His suprapubic Foley catheter was changed earlier today in the clinic. No recent change in medications. No fever. The patient does not complain of any new focal neurological deficits. He does have paraplegia after having spinal cord infarct perioperatively during lumbar surgery at [facility] in 2003.   PAST MEDICAL HISTORY:  1. 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  2. Coronary artery disease with MI perioperatively in 2003.  3. Hyperlipidemia.  4. Hypertension.  5. Atrial fibrillation, on anticoagulation.  6. Chronic low back pain.  7. Benign prostatic hypertrophy, history of prostatitis.  8. Suprapubic catheter.  9. Colonic polyps.   10. Depression.  11. Degenerative disease of bilateral knees.   PAST SURGICAL HISTORY:  1. Tonsillectomy. 2. Back surgeries for disk disease. 3. Suprapubic catheter placement for neurogenic bladder.   ALLERGIES: No known drug allergies.   SOCIAL HISTORY: The patient lives at home with his wife. Moves around in a wheelchair. Does not smoke, drink alcohol, or use illicit drugs.   CODE STATUS: The patient does not want any intubation but is okay for cardiopulmonary resuscitation.   FAMILY HISTORY: The patient's mom and dad did not have any medical problems as per prior records.   HOME MEDICATIONS:  1. Baclofen 10 mg oral once a day.  2. Hydrochlorothiazide/lisinopril 25/20 1 tablet oral once a day.  3. Methocarbamol 750 mg oral 3 times a day.  4. Metoprolol 50 mg oral twice a day.  5. Nitrofurantoin 25 mg orally once a day.  6. Nortriptyline 10 mg oral once a day.  7. Pravastatin 40 mg oral once a day.  8. Lyrica 100 mg oral 3 times a day.  9. Coumadin 5 mg oral once a day.   REVIEW OF SYSTEMS: CONSTITUTIONAL: No fever or chills but she does have fatigue. EYES: No blurred vision or redness of eyes. ENT: No sinus congestion or nasal discharge. NECK: No neck swelling or pain. CARDIOVASCULAR: No chest pain but does complain of occasional shortness of breath. No PND or orthopnea. RESPIRATORY: No wheezing, cough. GI: No abdominal pain, diarrhea. GENITOURINARY: Does not make any urine. Does have chronic suprapubic catheter. SKIN: No petechiae, rash. MUSCULOSKELETAL: Has chronic low back pain. NEUROLOGIC: Paraplegia, neurogenic bladder.     PHYSICAL EXAMINATION:   VITAL SIGNS: Temperature 97.6, pulse ranging  between 45 to 55, respirations 20, blood pressure 153/90, pulse oximetry 99% on 2 liters oxygen.   GENERAL: Obese African American male patient lying comfortably in bed, drowsy.   PSYCHIATRIC: Drowsy, opens his eyes on calling his name. Flat affect. Not agitated.   HEENT:  Atraumatic, normocephalic. Oral mucosa moist and pink. No ulcers or thrush. External ears and nose normal. No pallor. No icterus. Pupils bilaterally equal and reactive to light.   NECK: Supple. No thyromegaly. No palpable lymph nodes. Trachea midline. No carotid bruit.   CARDIOVASCULAR: S1, S2 has a systolic murmur not to the carotids. Peripheral pulses 2+. Has 1+ edema.   RESPIRATORY: Normal work of breathing. Clear to auscultation on both sides.   GI: Soft abdomen, nontender. Bowel sounds present.   GENITOURINARY: No suprapubic tenderness. Has suprapubic catheter. No discharge around the insertion site.   MUSCULOSKELETAL: No joint swelling, redness, or effusion of the large joints. Normal muscle tone.   NEUROLOGIC: Paraplegia of left lower extremities. Sensation to fine touch intact all over.   LABORATORY, DIAGNOSTIC, AND RADIOLOGICAL DATA: Glucose 116. BNP 5828. BUN 15, creatinine 1.36, sodium 135, potassium 3.9, chloride 101, GFR 49. AST, ALT, alkaline phosphatase normal. Troponin 0.04. WBC 4.6, hemoglobin 13.6, platelets 100. INR 5.9. Urinalysis shows 24 WBCs and 3+ bacteria.   EKG shows regular rhythm with heart rate of 47 and episodes of atrial fibrillation.   Chest x-ray shows pulmonary edema.   CT scan of the head without contrast showed stable CT of the brain with evidence of atrophy, chronic small vessel ischemic disease, and an old right frontoparietal small area of encephalomalacia and basal ganglia lacunar infarcts.   ASSESSMENT AND PLAN:  1. Bradycardia in the setting of atrial fibrillation in a patient who is anticoagulated. The patient is on metoprolol which will be discontinued which could be causing bradycardia. Will monitor patient on tele floor. His bradycardia seems to be improving at this time. Cardiac enzymes are normal. Will check two more sets of cardiac enzymes. If his bradycardia does not improve, he might need a pacemaker and a Cardiology consult.  2. Acute  encephalopathy likely secondary from the urinary tract infection the patient has. The patient does have a suprapubic catheter with Foley which was changed earlier today. This does not have to be changed again. Will start him on antibiotics and wait for urine culture results.  3. Slurred speech. This could be secondary to his acute encephalopathy from the urinary tract infection but this seems to have had an acute onset as per the daughter. With history of old strokes on the CT, will get an MRI of the brain to rule out any acute stroke along with echocardiogram and 2-D echocardiogram and carotid Doppler's. Will check a fasting lipid profile in the morning.  4. Pulmonary edema. The patient does not complain of any shortness of breath but does seem to have pulmonary edema on his chest x-ray with elevated BNP. Will start him on IV Lasix for diuresis. This can be transitioned to oral Lasix at the time of discharge.  5. Supratherapeutic INR. No recent change in Coumadin dose but the patient's INR is elevated at 5.6. No acute bleed. Will hold his Coumadin at this time. Will recheck INR in a.m.  6. DVT prophylaxis. The patient is on Coumadin.   CODE STATUS: The patient is a LIMITED CODE with no intubation but okay for cardiopulmonary resuscitation.   TIME SPENT: Time spent today on this case was 55 minutes with more than  50% time spent in coordination of care.   ____________________________ Molinda BailiffSrikar R. Dennis Hegeman, MD srs:drc D: 10/17/2011 22:38:03 ET T: 10/18/2011 07:22:10 ET JOB#: [id number]  cc: Wardell HeathSrikar R. Antero Derosia, MD, <Dictator> Reola MosherAndrew S. Randa LynnLamb, MD Orie FishermanSRIKAR R Delicia Berens MD ELECTRONICALLY SIGNED 10/23/2011 13:47

## 2014-07-12 NOTE — Discharge Summary (Signed)
PATIENT NAME:  Carl Molina, Carl Molina MR#:  [id number] DATE OF BIRTH:  1929-12-28  DATE OF ADMISSION:  10/17/2011 DATE OF DISCHARGE:  10/21/2011  ADMITTING PHYSICIAN: Milagros Loll, MD   ADMITTING PHYSICIAN: Enid Baas, MD  PRIMARY CARE PHYSICIAN: Alonna Buckler, MD  CONSULTANTS: Dorothyann Peng, MD - Cardiology.  DISCHARGE DIAGNOSES:  1. Bradycardia with junctional rhythm on admission, off of metoprolol now.  2. Metabolic encephalopathy.  3. Urinary tract infection.  4. Paraplegia.  5. History of myocardial infarction with spinal artery infarct resulting in paraplegia.  6. Chronic atrial fibrillation, on Coumadin.  7. Hypertension.  8. Supratherapeutic INR on admission. Warfarin dose adjusted at discharge.  9. Chronic kidney disease, stage II.  10. Chronic systolic heart failure with ejection fraction of 35 to 45%.   DISCHARGE HOME MEDICATIONS:  1. Baclofen 10 mg p.o. daily.  2. Nitrofurantoin microcrystals 25 mg p.o. daily.  3. Methocarbamol 750 mg p.o. three times daily. 4. Nortriptyline 10 mg p.o. daily.  5. Pravachol 40 mg p.o. daily.  6. HCTZ/Lisinopril 25 mg/20 mg p.o. daily.  7. Pregabalin 100 mg p.o. three times daily. 8. Coumadin dose adjusted to 2 mg p.o. daily and adjust based on INR.  9. Lasix 20 mg p.o. daily.  10. Levaquin 500 mg p.o. daily for three more days.  DISCHARGE DIET: Low sodium, low fat diet.   DISCHARGE ACTIVITY: As tolerated.   FOLLOWUP INSTRUCTIONS:  1. Primary care physician follow-up in 1 to 2 weeks.  2. PT/INR check in three days.  3. Stop taking Lopressor.  4. Cardiology followup in 3 to 4 weeks.   LABS AND IMAGING STUDIES: INR prior to discharge is 1.9. Urine cultures are negative. INR on admission was 4.5.   MRI of the brain without contrast showed no evidence of acute ischemic infarction. Encephalomalacia and high posterior frontal and anterior right parietal lobe region, corresponds to his area of hyperdensity from his CAT scan from  10/17/2011. No hydrocephalus. No acute intracranial hemorrhage.   Chest x-ray on admission showed chronic obstructive pulmonary disease changes and stable cardiomegaly.   Echo Doppler showed an ejection fraction of 35 to 45%, mild concentric LVH and moderate inferior inferoseptal wall hypokinesis. Right-sided pressures are elevated.   WBC 4.4, hemoglobin 14.8, hematocrit 44.8, and platelet count 130.   Sodium 139, potassium 4.1, chloride 102, bicarbonate 28, BUN 14, creatinine 1.28, glucose 89, and calcium 9.3   ALT 34, AST 50, alkaline phosphatase 138, total bilirubin 2.1, and albumin 3.5. Hemoglobin A1c is 5.0. LDL is 54, HDL 51, total cholesterol 045, and triglycerides 61.   Ultrasound Doppler carotid showed no hemodynamically significant stenosis.   Urinalysis was positive for 3+ leukocyte esterase, 2+ bacteria, and few WBCs.  CT of the head on admission showed chronic small vessel ischemic changes and old right frontal parietal area of encephalomalacia. Basal ganglia lacunar infarcts are present.   BRIEF HOSPITAL COURSE: Carl Molina is a 81 year old African American male with history of paraplegia and history of CVA with an indwelling suprapubic catheter who was sent in from [facility] clinic for bradycardia with heart rate in the 40s with junctional rhythm and also presented with altered mental status.  1. Bradycardia with junctional rhythm. The patient was taking metoprolol at home which was suspended while in the hospital. His heart rate has been steady in the hospital from 60 to 70 range. He was advised to stay off of his metoprolol for now until seen by cardiology as an outpatient.  2. Altered  mental status likely metabolic encephalopathy secondary to urinary tract infection. He was started on antibiotics, Rocephin, in the hospital. His cultures came back negative and he is being discharged on three more days of Levaquin. His mental status is back to normal at this time.   3. Pulmonary edema with congestive heart failure. No prior history of congestive heart failure. Echo is showing EF of 35 to 45%. He was diuresed with Lasix twice a day. Now he is euvolemic so he is being discharged on Lasix daily. He is also on lisinopril and his beta blocker is stopped at the time of discharge.  4. History of paraplegia. He is back on his home medications of Baclofen, methocarbamol, and also nortriptyline. He is also on Pregabalin.  5. Chronic indwelling suprapubic Foley catheter. [facility] and had it recently replaced. He is on nitrofurantoin for possibly chronic suppression for urinary tract infections. He will finish off his Levaquin soon. 6. Chronic atrial fibrillation. Heart rate is well controlled, actually bradycardic on admission, off of beta blocker at this time. He is on Coumadin. His INR was 4.5 on admission and it was stopped and started again and INR is 1.9 so Coumadin dose adjusted to 2 mg and he has a follow-up INR in three days.   His course has been otherwise uneventful in the hospital. He was living at home with wife and will be discharged back home.   DISCHARGE CONDITION: Stable.           DISCHARGE DISPOSITION: Home.   TIME SPENT ON DISCHARGE: 45 minutes. ____________________________ Enid Baasadhika Cleaven Demario, MD rk:slb D: 10/21/2011 16:08:03 ET T: 10/22/2011 14:12:03 ET JOB#: [id number]  cc: Enid Baasadhika Leman Martinek, MD, <Dictator> Reola MosherAndrew S. Randa LynnLamb, MD Enid BaasADHIKA Rockney Grenz MD ELECTRONICALLY SIGNED 10/27/2011 7:28

## 2014-07-12 NOTE — Consult Note (Signed)
PATIENT NAME:  Carl Molina, Carl Molina MR#:  [id number] DATE OF BIRTH:  11-Jul-1929  DATE OF CONSULTATION:  10/18/2011  REFERRING PHYSICIAN:  [facility] CONSULTING PHYSICIAN:  Tyra Michelle Carl. Juliann Paresallwood, MD CARDIOLOGIST: Marcina MillardAlexander Paraschos, MD    PRIMARY CARE PHYSICIAN: Alonna BucklerAndrew Lamb, MD   INDICATION: Bradycardia with altered mental status.   HISTORY OF PRESENT ILLNESS: Carl Molina is a 81 year old African American male with history of atrial fibrillation, on chronic anticoagulation. He has coronary artery disease, paraplegia, neurogenic bladder, indwelling suprapubic catheter, came to Emergency Room after being sent from the clinic with severe bradycardia and altered mental status. In the Emergency Room, he was shown to have heart rates in the 40s with junctional rhythm with some altered mental status as per his daughter, who was at his bedside. He was evaluated by the hospitalist and subsequently was admitted to the hospital for evaluation. He had some confusion and had some trouble with his speech earlier, according to his daughter, with confusion. He uses an electronic chair but is mostly bedbound. He denies any recent fever. His paraplegia was due to a spinal infarct perioperatively during lumbar surgery in 2003.   REVIEW OF SYSTEMS: No blackout spells. No syncope. No nausea, no vomiting. No fever. No chills. No sweats. No weight loss. No weight gain. No hemoptysis or hematemesis. No bright red blood per rectum. Denies any hearing change. Denies sputum production. Denies cough.   PAST MEDICAL HISTORY:  1. Paraplegia, coronary artery disease, myocardial infarction.  2. Hyperlipidemia.  3. Hypertension.  4. Atrial fibrillation. 5. Chronic back pain. 6. Benign prostatic hypertrophy.  7. Chronic suprapubic catheter.  8. Neurogenic bladder.  9. Colonic polyps.  10. Depression.  11. Degenerative joint disease.   PAST SURGICAL HISTORY:  1. Tonsillectomy.  2. Back surgery for his disc.  3. Suprapubic catheter  placement.   ALLERGIES: None.   SOCIAL HISTORY: He lives with his wife. He moves around in a wheelchair. Disabled. Denies smoking or alcohol consumption.  CODE STATUS:  DO NOT INTUBATE.     FAMILY HISTORY: The patient states it is noncontributory.   MEDICATIONS:  1. Baclofen 10 mg once a day.  2. HCTZ/lisinopril 25/20, 1 tablet a day. 3. Methocarbamol 750 t.i.Carl.  4. Metoprolol 50 mg b.i.Carl.  5. Nitrofurantoin once a day.  6. Nortriptyline 10 mg once a day.  7. Pravastatin 40 mg a day.  8. Lyrica 100 mg t.i.Carl.  9. Coumadin 5 mg a day.   PHYSICAL EXAMINATION:  VITAL SIGNS: Blood pressure 153/90, pulse 48, respiratory rate 16, afebrile.   HEENT: Normocephalic, atraumatic. Pupils equal, reactive to light.   NECK: Supple. No significant jugular venous distention, bruits or adenopathy.   LUNGS: Clear to auscultation and percussion. No significant wheeze, rhonchi, or rales.   HEART: Bradycardic, regular, positive S4. Systolic ejection murmur left sternal border. PMI nondisplaced.   NEUROLOGICAL:  Exam is intact except for he is a paraplegic. Lower extremity weakness and  hemiparesis.  SKIN: Exam was normal. Peripheral pulses were normal.   LABORATORY, DIAGNOSTIC AND RADIOLOGICAL DATA: Glucose 116, BNP 5800, BUN 15, creatinine 1.36, sodium 135, potassium 3.9. LFTs were normal. Troponin 0.04. White count 46, hemoglobin 13.6, platelet count 100 and INR 5.9. Urinalysis:  24 white blood cells, 3+ bacteria. EKG: Regular rhythm, bradycardia, possibly junctional atrial fibrillation, nonspecific ST-T wave changes. Chest x-ray shows mild pulmonary edema. CT of the head: No acute changes. There is some chronic small vessel disease.   ASSESSMENT:  1. Bradycardia.  2. Renal insufficiency.  3. Urinary tract infection, possible urosepsis. 4. Encephalopathy. 5. Pulmonary edema by chest x-ray. 6. Coronary artery disease by history. 7. Atrial fibrillation. 8. Coagulopathy with prolonged INR  secondary to Coumadin.  9. Hypertension.   PLAN:  1. Continue current medications. We will hold Lopressor, however, because of severe bradycardia. Will also consider holding clonidine for the same reason and control his blood pressure with something else.  2. Follow up urine cultures. Start broad-spectrum antibiotics.  3. Follow up renal function and hydrate the patient. Continue to watch his PT/ INR. We will probably not give vitamin K and allow his PT/INR to normalize on its own.  4. We will maintain a suprapubic catheter and flush Foley as necessary.  5. We will consider Nephrology input.  6. A 2-Carl echocardiogram to assess LV function and wall motion.  7. Follow up lipids.  8. Hopefully the patient's mental status will improve once he is treated for possible infection and dehydration and his heart rate is improved. I would probably hold off on permanent pacemaker at this point and try to treat the patient medically by adjusting his medications.   ____________________________ Bobbie Stack Juliann Pares, MD ddc:cbb Carl: 10/19/2011 17:11:49 ET T: 10/20/2011 10:41:57 ET JOB#: [id number] Carl Molina Carl Shellie Rogoff MD ELECTRONICALLY SIGNED 10/23/2011 23:12

## 2014-07-16 NOTE — Consult Note (Signed)
PATIENT NAME:  Carl Molina, Carl Molina MR#:  [id number] DATE OF BIRTH:  Feb 05, 1930  DATE OF CONSULTATION:  01/03/2014  REFERRING PHYSICIAN:  Cletis Athens. Hower, MD.  CONSULTING PHYSICIAN:  Yoshi Mancillas D. Juliann Pares, MD  PRIMARY CARE PHYSICIAN:  Lodema Hong, MD    INDICATION: Altered mental status, shortness of breath.    HISTORY OF PRESENT ILLNESS:  The patient is an 81 year old African American male with paraplegia since 2003 with neurogenic bladder requiring indwelling suprapubic catheter, has a history of systolic congestive heart failure, ejection fraction of around 35%, presented with shortness of breath and some confusion as per the family at bedside. Seemed more confused and drowsy over the last 2-3 days. The patient also noticed some shortness of breath while lying flat. He has had some worsening leg edema, occasional cough, which is dry. No sputum production. No fever, chills, or sweats. Confusion has been intermittent but worse recently. The patient is aware that he is a little more confused and a little more lethargic but arousable.  He has been on Lasix, decreased the dose from 40 to 20, not clear why that happened.  In the Emergency Room he was found to be hyponatremic with sodium of 119.   REVIEW OF SYSTEMS:  No blackout spells, denies presyncope. No significant nausea, vomiting. No fever, no chills, no sweats. Denies weight loss or weight gain.   or hematemesis.  Denies bright red blood per rectum .  No significant sputum production.  He has had a cough. He has had some leg edema.  He has some generalized weakness. History of paraplegia.   PAST MEDICAL HISTORY: Paraplegia, coronary artery disease, hyperlipidemia, hypertension, paroxysmal atrial fibrillation, BPH, neurogenic bladder, systolic congestive heart failure, moderate cardiomyopathy.   SOCIAL HISTORY: Disabled, wheelchair bound, paraplegia, no smoking or alcohol consumption.   FAMILY HISTORY: Essentially noncontributory.   ALLERGIES:  None.   MEDICATIONS: Tylenol 1 tablet 3 times a day as needed, Restoril 20 mg at bedtime p.r.n., Coumadin 2 mg at bedtime, Lyrica 100 mg 3 times daily,  nortriptyline 10 mg 2 capsules at bedtime, hydroxyzine 10 mg twice a day p.r.n. itching, Senna-S 3 tablets at bedtime, magnesium 4 mg 2 tablets daily,  baclofen 10 mg 1/2 tablet in the morning, 1 tablet in the evening, methocarbamol 750 two tablets  3 times a day, ciprofloxacin 500 mg twice a day.  The patient also reports to be on Lasix, but he is not clear.  He was just now put on this medication   but states he was on 20 mg a day.   PHYSICAL EXAMINATION:  VITAL SIGNS: Blood pressure was elevated at 190/90, pulse 60, respiratory rate 18, afebrile.  HEENT: Normocephalic, atraumatic. Pupils equal and reactive to light, slightly lethargic.  NECK: Supple. No significant JVD, bruits or adenopathy.  LUNGS: Mild rhonchi. No wheezing, no rales. Adequate air movement.  HEART: Bradycardic.  Positive S1 and S2.  Positive S3. Soft S4. PMI nondisplaced.  ABDOMEN:  Positive bowel sounds, no rebound, no guarding    EXTREMITIES:  Good pulses, decreased muscle tone.   NEUROLOGICAL: Paraplegia.  Abnormal reflexes.  His cranial nerves are essentially intact. Paralyzed from the waist down.   SKIN:  Grossly unremarkable.   IMAGING:  EKG: Sinus bradycardia, nonspecific ST-T wave changes. Chest x-ray: Cardiomegaly, otherwise negative.   LABORATORY DATA:  Sodium was 119, potassium 4.2, chloride of 83, bicarbonate of 25, BUN of 15, creatinine of 1.17, glucose of 93,  BNP . Troponin 0.05.  White count 3.7, hemoglobin  14.9, platelet count 84,000.   Urinalysis unremarkable except for a 58 white cells, 20 red cells.  Leukocyte esterase 3+.   ASSESSMENT:  Altered mental status, hyponatremia, cardiomyopathy, coronary artery disease, urinary tract infection, paroxysmal atrial fibrillation, paraplegia, weakness, fatigue, edema, neurogenic bladder, hyperlipidemia.    PLAN:  1.  Agree with admit. Correct electrolyte abnormalities. Hopefully gradually improve on sodium to about 135.  I am concerned that  low sodium is contributing to his altered mental status and his volume status.  2.  For edema, may need Lasix therapy, possibly fluid restriction.  3.  Atrial fibrillation. Continue rate control as well as anticoagulation with Coumadin.  4.  Urinary tract infection. Recommend urine culture and antibiotic therapy.  5.  Continue DVT prophylaxis, he is already on Coumadin.  6.  Neurogenic bladder. Continue suprapubic catheter.  7.  Cardiomyopathy appears to be compensated.  We will continue the current therapy.  7.  I do not believe that his mental status is related to his cardiac status.  I think this is probably electrolyte abnormality related, but his edema needs to be addressed either mechanically with stockings or   increasing doses of Lasix to help with diuresis.   Will continue to follow the patient.     ____________________________ Bobbie Stackwayne D. Juliann Paresallwood, MD ddc:lt D: 01/04/2014 08:42:00 ET T: 01/04/2014 09:24:07 ET JOB#: [id number]  cc: Ozetta Flatley D. Juliann Paresallwood, MD, <Dictator> Alwyn PeaWAYNE D Bailey Kolbe MD ELECTRONICALLY SIGNED 01/26/2014 10:52

## 2014-07-16 NOTE — Discharge Summary (Signed)
PATIENT NAME:  Carl Molina, Kenji E MR#:  [id number] DATE OF BIRTH:  1929/10/30  Date of admission 01/02/2014 DATE OF DISCHARGE:  01/04/2014 Transfer to [facility].   DISCHARGE DIAGNOSES: 1.  Acute exacerbation of congestive heart failure.  2.  Systolic dysfunction with ejection fraction of 35%.  3.  Hyponatremia due to volume overload.  4.  Acute metabolic encephalopathy: Resolved.  5.  Urinary tract infection.  6.  Paroxysmal atrial fibrillation.  7.  Paraplegia since 2003.  8.  Coronary artery disease, status post percutaneous coronary intervention with stent placement.  9.  Hyperlipidemia.  10.  Hypertension.  11.  Benign prostatic hypertrophy.  12.  Neurogenic bladder with chronic suprapubic catheterization.   CONSULTATIONS: 1.  Cardiology, Dwayne D. Callwood, MD  2.  Nephrology, Lennox PippinsMunsoor N. Lateef, MD  PROCEDURES: 1. CT of the head performed October 12 shows no acute intracranial pathology. There is generalized cerebral atrophy. There is periventricular white matter low attenuation likely secondary to microangiopathy. Ventricles and sulci appropriate for patient's age.  2.  Chest x-ray October 11 shows stable cardiomegaly. No active lung disease.  3.  Chest x-ray October 11 shows cardiomegaly. Central pulmonary vascular prominence.   BRIEF HISTORY OF PRESENT ILLNESS:  This very pleasant 81 year old African American gentleman with history of paraplegia since 2003 with neurogenic bladder requiring indwelling suprapubic catheter as well as systolic congestive heart failure with an ejection fraction of 35%. He presents with shortness of breath and confusion. Per the family, he has become more confused and drowsy over the past 2-3 days. They also noticed shortness of breath, mainly when lying flat and worsening lower extremity edema. The patient denies fevers, chills, cough, chest pain.  He notes that at home he recently decreased Lasix dose from 40 mg to 20 mg. Hospitalist services are asked to  admit for urinary tract infection, hyponatremia with a sodium of 119, and congestive heart failure exacerbation.  HOSPITAL COURSE AND TREATMENT: Problem #1:  Acute on chronic congestive heart failure: Systolic dysfunction with decreased ejection fraction at 35%. On presentation the patient was frankly dyspneic with wheezing and crackles. BNP was 5561. He has done well with diuresis and at the time of discharge is -3 plus liters. Weight has decreased from 237.7 pounds to 234.6 pounds. Work of breathing is greatly decreased though he is still requiring supplemental oxygenation.  Problem #2:  Hyponatremia: Thought to be due to volume overload. Presenting sodium was 119, which has improved with diuresis to 126. Appreciate nephrology input. We will continue with diuresis to improve serum sodium.  Problem #3:  Acute metabolic encephalopathy: Likely due to severe hyponatremia with a serum sodium of 119. On the day after admission, the patient became slightly lethargic and disoriented. Symptoms also partially related to urinary tract infection. At time of transfer to the [facility], the patient is alert, oriented.  Problem #4:  Urinary tract infection: This patient has a chronic indwelling suprapubic catheter. Urine showed 58 white blood cells per high-powered field. Microbiology has shown greater than 100,000 colony-forming units of gram-negative rods. Sensitivities and further speciation to follow. He has been treated with Rocephin throughout his hospitalization.  Problem #5:  Paroxysmal atrial fibrillation: The patient has been in normal sinus rhythm. We will continue with warfarin for anticoagulation. Rate has been well controlled.  Problem #6: This [facility] is [facility] and would like to be transferred to the [facility] [place] where his primary care physician is.  He will be transferred once a telemetry bed  is available at that facility.   DISCHARGE PHYSICAL EXAMINATION: VITAL SIGNS: Temperature 97.3, pulse 67,  respirations 19, blood pressure 129/76, oxygenation 97% on 3 liters.  GENERAL: Alert, oriented, in no distress.  HEENT: Pupils are equal, round, and reactive to light. Conjunctivae are clear. Oral mucous membranes are pink and moist, trachea is midline.  RESPIRATORY: Lungs are clear to auscultation bilaterally with good air movement, no respiratory distress.  CARDIOVASCULAR: Regular rate and rhythm, no murmurs, rubs, or gallops, there is 1+ peripheral edema bilaterally, peripheral pulses are 1+.  ABDOMEN: Soft, nontender, nondistended. Bowel sounds are normal. There is no guarding, no hepatosplenomegaly.  EXTREMITIES: There is 2+ peripheral edema in the lower extremities, no hot, swollen, or tender joints. Strength is 3/4 in the upper extremities. The patient is paralyzed in the lower extremities.  NEUROLOGIC: Cranial nerves are grossly intact. Paralyzed in lower extremities.  PSYCHIATRIC: The patient is alert and oriented with good insight. No signs of uncontrolled depression or anxiety.   LABORATORY DATA: Sodium 126, potassium 3.8, chloride 89, bicarbonate 28, BUN 17, creatinine 1.2, blood sugar 130, total protein 6.3, albumin 3.4, bilirubin 1.5, AST 47, ALT 33, alkaline phosphatase 106, CK 292, CK-MB 10.2. Troponins have been negative x 3 over the course of admission. White blood cells 3.4, hemoglobin 14.6, platelets 86,000, MCV 101.   CONDITION ON DISCHARGE:  Fair.   DISPOSITION: Transfer to [facility].   DISCHARGE MEDICATIONS: Please see discharge instructions and transfer sheet.  1.  Methocarbamol 750 mg 2 tablets orally 3 times a day.  2.  Lyrica 100 mg 1 capsule 3 times a day.  3.  Baclofen 10 mg, 0.5 tablets every morning and 1 tablet in the evening.  4.  Acetaminophen 325 mg 2 tablets 3 times a day as needed for pain or fever.  5.  Hydroxyzine hydrochloride 10 mg 1 tablet orally twice a day as needed.  6.  Nortriptyline 10 mg 2 capsules once a day at bedtime.  7.  Warfarin 3 mg 1  tablet orally once a day to be dosed by pharmacy while inpatient.  8.  Lisinopril 20 mg 1 tablet once a day.  9.  Magnesium oxide 400 mg 2 tablets once a day.  10.  Docusate senna 50 mg/8.6 mg 2 tablets orally once a day.  11.  Albuterol-ipratropium 2.5 mg/0.5 mg/3 mL inhalation solution inhaled every 6 hours as needed for shortness of breath.  12.  Ceftriaxone 1 gram every 24 hours.  13.  Furosemide 40 mg injectable once a day.   DISCHARGE INSTRUCTIONS: Transfer to [facility]   TIME SPENT ON DISCHARGE: 45 minutes.   ____________________________ Ena Dawley. Clent Ridges, MD cpw:LT D: 01/04/2014 16:41:07 ET T: 01/04/2014 17:16:37 ET JOB#: [id number]  cc: Santina Evans P. Clent Ridges, MD, <Dictator> Gale Journey MD ELECTRONICALLY SIGNED 01/06/2014 15:07

## 2014-07-16 NOTE — Discharge Summary (Signed)
PATIENT NAME:  Carl Molina, Carl Molina MR#:  [id number] DATE OF BIRTH:  1929/06/22  DATE OF ADMISSION:  09/28/2013 DATE OF DISCHARGE:  10/06/2013   DISCHARGE DIAGNOSES:  1.  Accidental overdose of metoprolol.  2.  Bradycardia.  3.  Septic shock noted on admission secondary to urinary tract infection. 4.  Paraplegia. 5.  Acute renal failure.  6.  Hyponatremia, resolved.  7. false  Positive hypoglycemia with fingersticks but not with serum glucose.   DISCHARGE MEDICATIONS:  1.  Pravastatin 40 mg p.o. daily.  2.  Hydrochlorothiazide/lisinopril 12.5/20 mg p.o. daily.  3.  Lyrica 100 mg p.o. t.i.d.  4.  Baclofen 10 mg p.o. 0.5 tablet in the morning, 1 tablet in the evening.  5.  Acetaminophen 325 mg 2 tablets p.o. t.i.d.  6.  Hydroxyzine hydrochloride 10 mg p.o. b.i.d.  7.  Nortriptyline 10 mg p.o. 2 capsules once a day.  8.  Coumadin 1 mg 3 tablets daily.  9.  Aspirin 81 mg p.o. daily.  10. Amlodipine 5 mg p.o. daily.  11. Cipro 500 mg q.12 hours for 10 days.   CONSULTATIONS: Endocrinology consult with Dr. Tedd Sias.  Cardiology consult with Dr. Juliann Pares.   HOSPITAL COURSE:  1.  Bradycardia. The patient is an 81 year old male patient with coronary artery disease, chronic atrial fibrillation, paraplegia, with baseline bradycardia with heart rate in the 40s. He came in because of an accidental overdose of metoprolol. as the patient took 10 pills of metoprolol.  He ran out of methocarbamol and called his primary care doctor and the patient's prescription was misinterpreted and instead of methocarbamol that apparently was called in, the patient took metoprolol thinking that it was methocarbamol.  The patient took 50 mg of metoprolol, 10 tablets, that is 500 mg, and the patient's heart rate was as low as 20 when he came in so he was admitted to ICU for bradycardia with accidental overdose of metoprolol. The patient was admitted to ICU, started on dopamine drip.  Patient was alert and oriented and he was  admitted. He was seen by cardiology, Dr. Juliann Pares, who recommended continuing IV hydration, give triamterene for bradycardia and hypotension and he did not recommend any pacemaker placement.   The patient's blood pressure and heart rate improved nicely and the patient was in Intensive Care Unit for 3 days. We continued the dopamine drip for 2 days and the patient also received  IV fluids.  The patient's blood pressure was 89/59 and heart rate was at 48.  His heart rate improved,  his blood pressure improved. He was off the pressors and they continued IV fluids for hypertension and the patient was moved to telemetry. The patient also had bradycardia at home with heart rate in the 40s so we discontinued his metoprolol at home.  2.  Sepsis. The patient's white count was normal on admission but he had urine cultures which showed Serratia and Providencia and he has chronic Foley and chronic suprapubic catheter so we started antibiotics for catheter-related urinary tract infection and it was sensitive to Cipro so we discharged him home with Cipro. He received IV Rocephin while he was here.  3.  Acute renal failure and hypernatremia. The patient's sodium was 120, his BUN 43 and creatinine 2.11.  The patient thought to have hyponatremia, acute renal failure secondary to dehydration and so we continued IV hydration, and his renal function improved nicely. The patient's sodium was 136 and potassium was 5.1 on July 12.  The patient's creatinine was 1.13.  Blood cultures were negative and his urinary tract infection was thought to be due to suprapubic catheter-related urinary tract infection and we sent him home with Cipro. The patient advised to follow with primary doctor.  4.  Regarding bradycardia his EKG showed a heart rate of 40 on admission.  5.  Hypoglycemia. The patient's blood sugars were dropping 30s and the patient had random hypoglycemia and he has no history of diabetes. Because of persistent hypoglycemia he  received D5 IV fluids and also Glucagon and dextrose IV push.  I consulted Dr. Tedd SiasSolum and she recommended to check serum glucose when patient does get hypoglycemic and the patient's IV fluids were discontinued and we checked Accu-Cheks q.3 hours. The patient had cortisol levels after fasting which were appropriate and within normal range. The patient did have serum blood glucose at 12:30 yesterday which was 127 and the patient's fingerstick glucose was low at 38, but the serum glucose was normal indicating that it may be likely due to peripheral vascular disease, and he was found to erroneous hypoglycemia on fingerstick glucose testing, likely due to peripheral vascular disease and his serum glucose was 100 and the patient never had symptoms with hypoglycemia which was surprising, so Dr. Tedd SiasSolum thought it maybe a problem with fingerstick glucoses rather than his severe hypoglycemia which actually don't hold true and patient did not have any hypoglycemia, it is just a problem with poor circulation and poor fingerstick values on the meter.  The patient was advised no further treatment. The patient discharged home in stable condition.  6.  Regarding hypertension. He had an echocardiogram done, the patient's EF was low at 35-40% with decrease left ventricular function so we did continue his home medications, but no further workup. He did not have any trouble sleeping.   DISCHARGE VITAL SIGNS: Temperature 97.4, rate 62, blood pressure 160/67, oxygen saturation 96% on room air.   The patient was seen by physical therapy, they recommended home physical therapy, but the patient gets services through the [facility] and he was sent home in stable condition.   TIME SPENT: More than 30 minutes.    ____________________________ Katha HammingSnehalatha Aloura Matsuoka, MD sk:lt D: 10/07/2013 09:14:11 ET T: 10/07/2013 11:00:27 ET JOB#: [id number]  cc: Katha HammingSnehalatha Eural Holzschuh, MD, <Dictator> Katha HammingSNEHALATHA Hildagard Sobecki MD ELECTRONICALLY SIGNED 10/15/2013  11:23

## 2014-07-16 NOTE — Consult Note (Signed)
Chief Complaint:  Subjective/Chief Complaint More awake today. Denies cp No sob. HR still low but bp better.   VITAL SIGNS/ANCILLARY NOTES: **Vital Signs.:   09-Jul-15 08:00  Vital Signs Type Routine  Temperature Temperature (F) 97.5  Celsius 36.3  Temperature Source oral  Pulse Pulse 45  Respirations Respirations 18  Systolic BP Systolic BP 92  Diastolic BP (mmHg) Diastolic BP (mmHg) 77  Mean BP 82  Pulse Ox % Pulse Ox % 96  Pulse Ox Activity Level  At rest  Oxygen Delivery Room Air/ 21 %  *Intake and Output.:   09-Jul-15 10:00  Grand Totals Intake:  7.9 Output:  275    Net:  -267.1 24 Hr.:  -251.3  Dopamine      In:  7.9  Urine ml     Out:  275  Urinary Method  Suprapubic Catheter   Brief Assessment:  GEN well developed, well nourished, no acute distress, obese   Cardiac Irregular  -- LE edema   Respiratory normal resp effort  rhonchi   Gastrointestinal Normal   Gastrointestinal details normal Soft   EXTR positive edema   Lab Results: Routine Micro:  09-Jul-15 16:03   Micro Text Report BLOOD CULTURE   COMMENT                   NO GROWTH IN 48 HOURS   ANTIBIOTIC                       Micro Text Report BLOOD CULTURE   COMMENT                   NO GROWTH IN 48 HOURS   ANTIBIOTIC                       Culture Comment NO GROWTH IN 48 HOURS  Result(s) reported on 02 Oct 2013 at 04:00PM.  Culture Comment NO GROWTH IN 48 HOURS  Result(s) reported on 02 Oct 2013 at 04:00PM.  Routine Chem:  09-Jul-15 04:01   Glucose, Serum 92  BUN  40  Creatinine (comp)  1.80  Sodium, Serum  126  Potassium, Serum 4.7  Chloride, Serum  92  CO2, Serum 25  Calcium (Total), Serum 9.3  Anion Gap 9  Osmolality (calc) 263  eGFR (African American)  40  eGFR (Non-African American)  35 (eGFR values <16m/min/1.73 m2 may be an indication of chronic kidney disease (CKD). Calculated eGFR is useful in patients with stable renal function. The eGFR calculation will not be reliable  in acutely ill patients when serum creatinine is changing rapidly. It is not useful in  patients on dialysis. The eGFR calculation may not be applicable to patients at the low and high extremes of body sizes, pregnant women, and vegetarians.)  Routine Coag:  09-Jul-15 04:01   Prothrombin  36.2  INR 3.8 (INR reference interval applies to patients on anticoagulant therapy. A single INR therapeutic range for coumarins is not optimal for all indications; however, the suggested range for most indications is 2.0 - 3.0. Exceptions to the INR Reference Range may include: Prosthetic heart valves, acute myocardial infarction, prevention of myocardial infarction, and combinations of aspirin and anticoagulant. The need for a higher or lower target INR must be assessed individually. Reference: The Pharmacology and Management of the Vitamin K  antagonists: the seventh ACCP Conference on Antithrombotic and Thrombolytic Therapy. CATFTD.3220Sept:126 (3suppl): [id number] A HCT value >55% may artifactually increase the  PT.  In one study,  the increase was an average of 25%. Reference:  "Effect on Routine and Special Coagulation Testing Values of Citrate Anticoagulant Adjustment in Patients with High HCT Values." American Journal of Clinical Pathology 2006;126:400-405.)  Routine Hem:  09-Jul-15 04:01   WBC (CBC) 4.2  RBC (CBC)  3.80  Hemoglobin (CBC) 13.2  Hematocrit (CBC) 40.1  Platelet Count (CBC)  121  MCV  106  MCH  34.8  MCHC 33.0  RDW 13.1  Neutrophil % 53.2  Lymphocyte % 17.1  Monocyte % 12.0  Eosinophil % 16.8  Basophil % 0.9  Neutrophil # 2.2  Lymphocyte #  0.7  Monocyte # 0.5  Eosinophil # 0.7  Basophil # 0.0 (Result(s) reported on 30 Sep 2013 at 05:01AM.)   Radiology Results: XRay:    09-Jul-15 09:01, Chest Portable Single View  Chest Portable Single View   REASON FOR EXAM:    hypotension. SOB  COMMENTS:       PROCEDURE: DXR - DXR PORTABLE CHEST SINGLE VIEW  - Sep 30 2013   9:01AM     CLINICAL DATA:  Hypotension and shortness of breath    EXAM:  PORTABLE CHEST - 1 VIEW    COMPARISON:  October 18, 2011    FINDINGS:  There is underlying emphysematous change. There is atelectatic  change in the right mid lung. There is no frank edema or  consolidation. There is cardiomegaly. The pulmonary vascularity is  within normal limits. No adenopathy. There is atherosclerotic change  in aorta.     IMPRESSION:  Stable cardiomegaly. Underlying emphysematous change. No appreciable  edema or consolidation. There is mild atelectatic change in the  right mid lung.      Electronically Signed    By: Lowella Grip M.D.    On: 09/30/2013 09:03       Verified By: Leafy Kindle. Jasmine December, M.D.,    09-Jul-15 14:22, Chest Portable Single View  Chest Portable Single View   REASON FOR EXAM:    CENTRAL LINE PLACEMENT  COMMENTS:       PROCEDURE: DXR - DXR PORTABLE CHEST SINGLE VIEW  - Sep 30 2013  2:22PM     CLINICAL DATA:  Central line placement    EXAM:  PORTABLE CHEST - 1 VIEW    COMPARISON:  09/30/2013    FINDINGS:  Right jugular central venous catheter in the mid SVC. Negative for  pneumothorax.  Cardiac enlargement without heart failure or pneumonia.     IMPRESSION:  Satisfactory central line placement.      Electronically Signed    By: Franchot Gallo M.D.    On: 09/30/2013 14:30         Verified By: Truett Perna, M.D.,  Cardiology:    07-Jul-15 15:22, ED ECG  Ventricular Rate 40  Atrial Rate 36  QRS Duration 118  QT 514  QTc 418  R Axis -31  T Axis 133  ECG interpretation   Undetermined rhythm  Left axis deviation  Inferior infarct (cited on or before 06-Sep-2004)  Anterior infarct (cited on or before 04-Jul-2009)  *** ** ** ** * ACUTE MI  ** ** ** **  Consider right ventricular involvement in acute inferior infarct  Abnormal ECG  When compared with ECG of 04-Dec-2011 11:49,  Current undetermined rhythm precludes rhythm comparison,  needs review  QRS duration has increased  Serial changes of Inferior infarct Present  ----------unconfirmed----------  Confirmed by OVERREAD, NOT ([id number]), editor PEARSON, BARBARA ([id number])  on 09/30/2013 2:53:55 PM  ED ECG     08-Jul-15 18:18, Echo Doppler  Echo Doppler   REASON FOR EXAM:      COMMENTS:       PROCEDURE: [place] - ECHO DOPPLER COMPLETE(TRANSTHOR)  - Sep 29 2013  6:18PM     RESULT: Echocardiogram Report    Patient Name:   Carl Molina Date of Exam: 09/29/2013  Medical Rec #:  [id number]         Custom1:  Date of Birth:  19-Apr-1929      Height:       69.0 in  Patient Age:    43 years       Weight:       231.0 lb  Patient Gender: M              BSA:          2.20 m??    Indications: Atrial Fib  Sonographer:    Janalee Dane RCS  Referring Phys: Hillary Bow, R    Summary:   1. Left ventricular ejection fraction, by visual estimation, is 35 to   40%.   2. Moderately decreased global left ventricular systolic function.   3. Mild to moderately increased left ventricular internal cavity size.   4. Severely enlarged right ventricle.   5. Moderately dilated left atrium.   6. Mildly dilated right atrium.   7. Mild mitral valve regurgitation.   8. Mildly elevated pulmonary artery systolic pressure.   9. Mild to moderate tricuspid regurgitation.  2D AND M-MODE MEASUREMENTS (normal ranges within parentheses):  Left Ventricle:          Normal  IVSd (2D):      1.12 cm (0.7-1.1)  LVPWd (2D):     0.99 cm (0.7-1.1) Aorta/LA:                  Normal  LVIDd (2D):     5.55 cm (3.4-5.7) Aortic Root (2D): 3.60 cm (2.4-3.7)  LVIDs (2D):     4.67 cm           Left Atrium (2D): 5.20 cm (1.9-4.0)  LV FS (2D):     15.9 %   (>25%)  LV EF (2D):     33.0 %   (>50%)                                    Right Ventricle:                                    RVd (2D):        6.10 cm  SPECTRAL DOPPLER ANALYSIS (where applicable):  Tricuspid Valve and PA/RV Systolic Pressure: TR Max Velocity: 2.26 m/s RA    Pressure: 20 mmHg RVSP/PASP: 40.5 mmHg    PHYSICIAN INTERPRETATION:  Left Ventricle: The left ventricular internal cavity size was mild to   moderately increased. LV septal wall thickness was normal. LV posterior     wall thickness was normal. Global LV systolic function was moderately   decreased. Left ventricular ejection fraction, by visual estimation, is   35 to 40%.  Right Ventricle: The right ventricular size is severely enlarged.  Left Atrium: The left atrium is moderately dilated.  Right Atrium: The right atrium is mildly dilated.  Pericardium: There is no evidence of pericardial effusion.  Mitral Valve: The mitral valve is normal in structure. Mild mitral valve   regurgitation is seen.  Tricuspid Valve: The tricuspid valve is normal. Mild to moderate   tricuspid regurgitation is visualized. The tricuspid regurgitant velocity   is 2.26 m/s, and with an assumed right atrial pressure of 20 mmHg, the   estimated right ventricular systolic pressure is mildly elevated at 40.5   mmHg.  Aortic Valve: The aortic valve is normal. The aortic valve is     structurally normal, with no evidence of sclerosis or stenosis. No   evidence of aortic valve regurgitation is seen.  Pulmonic Valve: The pulmonic valve is normal.    [place] MD  Electronically signed by F[place]on MD  Signature Date/Time: 09/30/2013/2:13:13 PM    *** Final ***    IMPRESSION: .        Verified By: Yolonda Kida, M.D., MD  CT:    08-Jul-15 11:27, CT Head Without Contrast  CT Head Without Contrast   REASON FOR EXAM:    encephalopathy  COMMENTS:       PROCEDURE: CT  - CT HEAD WITHOUT CONTRAST  - Sep 29 2013 11:27AM     CLINICAL DATA:  Encephalopathy.    EXAM:  CT HEAD WITHOUT CONTRAST    TECHNIQUE:  Contiguous axial images were obtained from the base of the skull  through the vertex without intravenous contrast.    COMPARISON:  Brain MRI 10/18/2011.  Head CT scan  05/06/2011.  FINDINGS:  Atrophy and chronic microvascular ischemic change are again seen.  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 No evidence  of acute abnormality including infarct, hemorrhage, mass lesion,  mass effect, midline shift or abnormal extra-axial fluid collection  is present. There is no pneumocephalus or hydrocephalus. Imaged  paranasal sinuses and mastoid air cells are clear. The calvarium is  intact.     IMPRESSION:  No acute finding.  Stable compared to prior exams.      Electronically Signed    By: Inge Rise M.D.    On: 09/29/2013 11:47         Verified By: Ramond Dial, M.D.,   Assessment/Plan:  Invasive Device Daily Assessment of Necessity:  Does the patient currently have any of the following indwelling devices? foley  central line catheter   Indwelling Urinary Catheter continued, requirement due to   Reason to continue Indwelling Urinary Catheter for strict Intake/Output monitoring for hemodynamic instability   Central Line continued, requirement due to   Reason to continue Central Line For infusion of hypertonic or irritating solutions or drugs   Assessment/Plan:  Assessment IMP Bradycardia AFIB Hypotension Overdose Chronic Pain Paraphegia CRI Chronic Pain .   Plan PLAN Continue ICU care Fluid hydration IV Low dose pressors as necessary for Bp Continue pain control F/U renal function for CRI DVT prophylaxis   Electronic Signatures: Lujean Amel D (MD)  (Signed 14-Jul-15 14:25)  Authored: Chief Complaint, VITAL SIGNS/ANCILLARY NOTES, Brief Assessment, Lab Results, Radiology Results, Assessment/Plan   Last Updated: 14-Jul-15 14:25 by Yolonda Kida (MD)

## 2014-07-16 NOTE — Consult Note (Signed)
Allergies:  No Known Allergies:   Assessment/Plan:  Assessment/Plan 81 yo M seen in consultation for hypoglycemia. Chart reviewed. Patient was intervewed and examined. He ahs had FSBS values as low as the 30s over the last few days. No documented lows on serum glucose testing. He reports no symptoms with lows. Good appetite and no N/V or recent weight loss. Not on any offending medications that would cause hypoglycemia. No individuals in the home with diabetes. Labs on 7/13 showed elevated insulin and c-peptide levels of 46 and 9.8 however serum BG at the time was 98 and he was not fasting. Sulfonylurea screen is pending.  A/ Recurrent hypoglycemia -- causes may include erroneous low FSBS as seen in patients with PVD, hyperinulinemic hypoglycemia (eg insulinoma, nesideoblastosis), or possibly medication-induced.  P/ Will DC the IV dextrose Will make NPO now Will check FSBS q3 hrs Once FSBS <55 with symptoms or FSBS <45 without symptoms, then get labs to confirm low serum BG with serum glucose, plus check cortisol to r/o adrenal insufficiency, plus insulin/c-peptide and betahydroxybutyrate to assess for hyperinsulinemia.  Full consult to be dictated. Will reassess tomorrow.   Electronic Signatures: Raj JanusSolum, Anna M (MD)  (Signed 14-Jul-15 18:06)  AuthoredForrestine Him: ALLERGIES, Assessment/Plan   Last Updated: 14-Jul-15 18:06 by Raj JanusSolum, Anna M (MD)

## 2014-07-16 NOTE — Consult Note (Signed)
PATIENT NAME:  Carl Molina, Carl Molina MR#:  [id number] DATE OF BIRTH:  21-Nov-1929  DATE OF CONSULTATION:  10/05/2013  REFERRING PHYSICIAN:  Epifanio Lesches, MD  CONSULTING PHYSICIAN:  A. Lavone Orn, MD  PRIMARY CARE PROVIDER:  Derinda Late, MD.  CHIEF COMPLAINT: Recurrent hypoglycemia.   HISTORY OF PRESENT ILLNESS: This is an 81 year old male with a history of coronary disease, atrial fibrillation, paraplegia, who was admitted on July 7, after taking excessive metoprolol intentionally and subsequently developing severe bradycardia. He has no prior history of diabetes. Over the course of his hospitalization, he has been noted to have recurrent low fingerstick blood sugar testing. There has been no exposure to hypoglycemia causing medications.  During the hospitalization, he has had fingerstick blood sugars in the 50-60 range on a daily basis and on July 12, at 7:00 a.m., he had 2 fingerstick blood sugars of 32 and 25, which improved after treatment and again on July 13, at 2:30 p.m., he had fingerstick blood sugar of 30.   Today, blood sugars have been in the 63-150 range. He had been on IV dextrose until mid day. The patient reports no known prior history of hypoglycemia. Also reports no problems with hyperglycemia or knowledge of history of diabetes. He denies lightheadedness on standing. Denies recent weight loss. Appetite has been good. States he has been eating most of the food provided during hospitalization. Denies nausea or vomiting. Additional labs on July 18,  included an elevated insulin level of 46.3, C-peptide of 9.8 at a time when fingerstick blood sugar was 95. A sulfonylurea screen obtained is still pending.   PAST MEDICAL HISTORY: 1.  Paraplegia due to spinal cord infarct perioperatively during lumbar surgery in 2003.  2.  Coronary artery disease, status post acute MI, 2003.  3.  Hyperlipidemia.  4.  Hypertension.  5.  Atrial fibrillation.  6.  BPH.  7.  History of prostatitis.   8.  Neurogenic bladder with suprapubic catheter.  9.  Degenerative joint disease.   PAST SURGICAL HISTORY: 1.  Tonsillectomy.  2.  Lumbar spine surgery.   MEDICATIONS:   1.  Dulcolax suppository 10 mg PR at bedtime.  2.  Amlodipine 5 mg daily.  3.  Ceftriaxone 1 gram daily.  4.  Docusate 50 mg b.i.d.  5.  Robaxin 500 mg b.i.d.  6.  Nortriptyline 20 mg at bedtime.  7.  Pravastatin 40 mg at bedtime.  8.  Lyrica 100 mg t.i.d.  9.  Senokot 8.8 mg b.i.d.  10.  Aspirin 81 mg daily.  11.  Coumadin 3 mg daily.   ALLERGIES: No known drug allergies.   SOCIAL HISTORY: The patient is married. Denies tobacco or illicit drug use.   FAMILY HISTORY: He has multiple relatives with diabetes including 4 siblings. A daughter, now age 3, developed diabetes at age 28. No individuals with diabetes live in his home.   REVIEW OF SYSTEMS:  GENERAL: Denies weight loss. Denies fevers.  HEENT: Denies blurred vision. Denies sore throat.  NECK: Denies neck pain or dysphagia.  CARDIAC: Denies chest pain or palpitation.  PULMONARY: Denies cough or shortness of breath.  ABDOMEN: Denies abdominal pain or nausea, vomiting.  EXTREMITIES: Denies recent development of lower extremity swelling.  SKIN: Denies rash or other skin changes.  NEUROLOGIC: He is paralyzed from the waist down.   PHYSICAL EXAMINATION: VITAL SIGNS: Height 68.9 inches, weight 237 pounds, BMI 35, temperature 97.9, pulse 57, respirations 18, BP 155/94, pulse oximetry 97% on room air.  GENERAL:  Well-developed African American male in no acute distress.  HEENT: EOMI.  Oropharynx is clear. Mucous membranes moist.  NECK: Supple. No thyromegaly.  CARDIAC: Regular rate and rhythm. No appreciable carotid bruit.  PULMONARY: Clear to auscultation bilaterally. Good inspiratory effort. No use of accessory muscles of respiration.  ABDOMEN: Diffusely soft, nontender. Positive bowel sounds.  EXTREMITIES: No peripheral edema is present.  SKIN: No rash  or dermatopathy is present.   LABORATORY DATA: 09/28/2013, TSH of 0.731, 10/03/2013, glucose 83, BUN 22, creatinine 1.1, sodium 136, potassium 5.1, eGFR greater than 60. WBC 4.8, hemoglobin 13.6, hematocrit 41.3, platelets 135,000. INR 2.6.   ASSESSMENT: An 81 year old male admitted with bradycardia and hypotension after unintentional excessive use of metoprolol, found to have incidental finding of recurrent low blood sugars on fingerstick testing. Causes of low blood sugars on fingerstick testing may be erroneous, as is seen in peripheral vascular disease. Therefore, use of serum, venous blood sugars would be more reliable, hyperinsulinemic state, such as with insulinoma or nesidioblastosis or due to medication-induced hypoglycemia, adrenal insufficiency.   RECOMMENDATIONS: 1.  I have stopped IV dextrose. I recommend we hold any supplemental glucose.  2.  Continue fingerstick blood sugar testing q. 3 hours. 3.  Will make him n.p.o. from now and overnight. The patient was agreeable. Plan to have serum blood sugar labs drawn should his fingerstick blood sugar be less than 45 if asymptomatic or less than 55 with symptoms of hypoglycemia (confusion, shakes, sweats) to include glucose, C-peptide, insulin, beta hydroxybutyrate, as well as cortisol level.  4.  Anticipate that if blood sugars are fairly stable overnight, he has no further severe hypoglycemia, then he could be considered ready for discharge tomorrow.  It may be worthwhile to give him a glucometer at discharge to test blood sugars on occasion as well. If he does have persistent hypoglycemia, although does well tonight, I certainly be available to see him in followup.   ____________________________ A. Lavone Orn, MD ams:ds D: 10/05/2013 21:49:57 ET T: 10/05/2013 22:41:15 ET JOB#: [id number]  cc: A. Lavone Orn, MD, <Dictator> Derinda Late, MD Sherlon Handing MD ELECTRONICALLY SIGNED 10/18/2013 22:47

## 2014-07-16 NOTE — Consult Note (Signed)
Chief Complaint:  Subjective/Chief Complaint No cp no sob . Sugars slightly better now.   VITAL SIGNS/ANCILLARY NOTES: **Vital Signs.:   13-Jul-15 12:09  Vital Signs Type Routine  Temperature Temperature (F) 97.4  Celsius 36.3  Temperature Source oral  Pulse Pulse 51  Respirations Respirations 18  Systolic BP Systolic BP 206  Diastolic BP (mmHg) Diastolic BP (mmHg) 46  Mean BP 67  Pulse Ox % Pulse Ox % 98  Pulse Ox Activity Level  At rest  Oxygen Delivery Room Air/ 21 %  *Intake and Output.:   13-Jul-15 11:15  Grand Totals Intake:  240 Output:      Net:  240 24 Hr.:  -195  Oral Intake      In:  240  Percentage of Meal Eaten  100   Brief Assessment:  GEN well developed, well nourished, no acute distress   Cardiac Regular  murmur present   Respiratory normal resp effort  clear BS   Gastrointestinal Normal   Gastrointestinal details normal Soft  Bowel sounds normal   EXTR paraplegia   Lab Results: General Ref:  13-Jul-15 09:57   Insulin + C-Peptide ========== TEST NAME ==========  ========= RESULTS =========  = REFERENCE RANGE =  INSULIN + C-PEPTIDE  Insulin and C-Peptide, Serum Insulin                         [H  46.3 uIU/mL          ]          2.6-24.9 C-Peptide, Serum                [H 9.8 ng/mL            ]           1.1-4.4 C-Peptide reference interval is for fasting patients.               [facility]            No: [id number]           [address], [place], [place] [address]           Lindon Romp, MD    [phone]   Result(s) reported on 05 Oct 2013 at 03:18PM.  Proinsulin ========== TEST NAME ==========  ========= RESULTS =========  = REFERENCE RANGE =  PROINSULIN  Proinsulin Proinsulin                      [H  15.1 pmol/L          ]          0.0-10.0               [facility]            No: [id number]           [address], [place], [place] [address]           Lindon Romp, MD         [phone]    Result(s) reported on 06 Oct 2013 at 01:47PM.  Sulfonylurea Screen ========== TEST NAME ==========  ========= RESULTS =========  = REFERENCE RANGE =  SULFONYLUREA SCREEN,QT  Sulfonylurea Screen, QT Acetohexamide                   [   Negative ug/mL       ]  20-60 Chlorpropamide                  [   Negative ug/mL       ]            75-250 Tolazamide                      [   Negative ug/mL       ]          UP TO 80 Tolbutamide                     [   Negative ug/mL       ]            40-100 Glimepiride                     [   Negative ng/mL   ]            80-250 Glipizide                       [   Negative ng/mL       ]          [phone] Glyburide                       [   Negative ng/mL       ]        UP TO 1500 Nateglinide                     [   Negative ng/mL       ]       UP TO10000 Repaglinide                     [   Negative ng/mL       ]         UP TO 200               [facility]       No: [id number]           [place] [address]           Krista Blue, MD               [phone]   Result(s) reported on 10 Oct 2013 at 09:16PM.  Routine Chem:  12-Jul-15 07:10   Glucose, Serum 83  Result Comment LABS - This specimen was collected through an   - indwelling catheter or arterial line.  - A minimum of 67ms of blood was wasted prior    - to collecting the sample.  Interpret  - results with caution.  Result(s) reported on 03 Oct 2013 at 07:28AM.  BUN  22  Creatinine (comp) 1.13  Sodium, Serum 136  Potassium, Serum 5.1  Chloride, Serum 104  CO2, Serum 27  Calcium (Total), Serum 8.7  Anion Gap  5  Osmolality (calc) 274  eGFR (African American) >60  eGFR (Non-African American) >60 (eGFR values <649mmin/1.73 m2 may be an indication of chronic kidney disease (CKD). Calculated eGFR is useful in patients with stable renal function. The eGFR calculation will not be reliable in acutely ill patients when serum creatinine is  changing rapidly. It is not useful in  patients on dialysis. The eGFR calculation may not  be applicable to patients at the low and high extremes of body sizes, pregnant women, and vegetarians.)  Routine Coag:  12-Jul-15 07:10   Prothrombin  27.4  INR 2.6 (INR reference interval applies to patients on anticoagulant therapy. A single INR therapeutic range for coumarins is not optimal for all indications; however, the suggested range for most indications is 2.0 - 3.0. Exceptions to the INR Reference Range may include: Prosthetic heart valves, acute myocardial infarction, prevention of myocardial infarction, and combinations of aspirin and anticoagulant. The need for a higher or lower target INR must be assessed individually. Reference: The Pharmacology and Management of the Vitamin K  antagonists: the seventh ACCP Conference on Antithrombotic and Thrombolytic Therapy. WGNFA.2130 Sept:126 (3suppl): N9146842. A HCT value >55% may artifactually increase the PT.  In one study,  the increase was an average of 25%. Reference:  "Effect on Routine and Special Coagulation Testing Values of Citrate Anticoagulant Adjustment in Patients with High HCT Values." American Journal of Clinical Pathology 2006;126:400-405.)  13-Jul-15 10:54   Prothrombin  25.2  INR 2.4 (INR reference interval applies to patients on anticoagulant therapy. A single INR therapeutic range for coumarins is not optimal for all indications; however, the suggested range for most indications is 2.0 - 3.0. Exceptions to the INR Reference Range may include: Prosthetic heart valves, acute myocardial infarction, prevention of myocardial infarction, and combinations of aspirin and anticoagulant. The need for a higher or lower target INR must be assessed individually. Reference: The Pharmacology and Management of the Vitamin K  antagonists: the seventh ACCP Conference on Antithrombotic and Thrombolytic Therapy. QMVHQ.4696 Sept:126  (3suppl): N9146842. A HCT value >55% may artifactually increase the PT.  In one study,  the increase was an average of 25%. Reference:  "Effect on Routine and Special Coagulation Testing Values of Citrate Anticoagulant Adjustment in Patients with High HCT Values." American Journal of Clinical Pathology 2006;126:400-405.)  Routine Hem:  12-Jul-15 07:10   WBC (CBC) 4.8  RBC (CBC)  3.95  Hemoglobin (CBC) 13.6  Hematocrit (CBC) 41.3  Platelet Count (CBC)  135  MCV  105  MCH  34.4  MCHC 32.9  RDW 13.2  Neutrophil % 40.0  Lymphocyte % 18.3  Monocyte % 11.2  Eosinophil % 29.3  Basophil % 1.2  Neutrophil # 1.9  Lymphocyte #  0.9  Monocyte # 0.5  Eosinophil #  1.4  Basophil # 0.1   Radiology Results: XRay:    09-Jul-15 09:01, Chest Portable Single View  Chest Portable Single View   REASON FOR EXAM:    hypotension. SOB  COMMENTS:       PROCEDURE: DXR - DXR PORTABLE CHEST SINGLE VIEW  - Sep 30 2013  9:01AM     CLINICAL DATA:  Hypotension and shortness of breath    EXAM:  PORTABLE CHEST - 1 VIEW    COMPARISON:  October 18, 2011    FINDINGS:  There is underlying emphysematous change. There is atelectatic  change in the right mid lung. There is no frank edema or  consolidation. There is cardiomegaly. The pulmonary vascularity is  within normal limits. No adenopathy. There is atherosclerotic change  in aorta.     IMPRESSION:  Stable cardiomegaly. Underlying emphysematous change. No appreciable  edema or consolidation. There is mild atelectatic change in the  right mid lung.      Electronically Signed    By: Lowella Grip M.D.    On: 09/30/2013 09:03       Verified By: Gwyndolyn Saxon  W. Jasmine December, M.D.,    09-Jul-15 14:22, Chest Portable Single View  Chest Portable Single View   REASON FOR EXAM:    CENTRAL LINE PLACEMENT  COMMENTS:       PROCEDURE: DXR - DXR PORTABLE CHEST SINGLE VIEW  - Sep 30 2013  2:22PM     CLINICAL DATA:  Central line  placement    EXAM:  PORTABLE CHEST - 1 VIEW    COMPARISON:  09/30/2013    FINDINGS:  Right jugular central venous catheter in the mid SVC. Negative for  pneumothorax.  Cardiac enlargement without heart failure or pneumonia.     IMPRESSION:  Satisfactory central line placement.      Electronically Signed    By: Franchot Gallo M.D.    On: 09/30/2013 14:30         Verified By: Truett Perna, M.D.,  Cardiology:    07-Jul-15 15:22, ED ECG  Ventricular Rate 40  Atrial Rate 36  QRS Duration 118  QT 514  QTc 418  R Axis -31  T Axis 133  ECG interpretation   Undetermined rhythm  Left axis deviation  Inferior infarct (cited on or before 06-Sep-2004)  Anterior infarct (cited on or before 04-Jul-2009)  *** ** ** ** * ACUTE MI  ** ** ** **  Consider right ventricular involvement in acute inferior infarct  Abnormal ECG  When compared with ECG of 04-Dec-2011 11:49,  Current undetermined rhythm precludes rhythm comparison, needs review  QRS duration has increased  Serial changes of Inferior infarct Present  ----------unconfirmed----------  Confirmed by OVERREAD, NOT ([id number]), editor PEARSON, BARBARA ([id number]) on 09/30/2013 2:53:55 PM  ED ECG     08-Jul-15 18:18, Echo Doppler  Echo Doppler   REASON FOR EXAM:      COMMENTS:       PROCEDURE: [place] - ECHO DOPPLER COMPLETE(TRANSTHOR)  - Sep 29 2013  6:18PM     RESULT: Echocardiogram Report    Patient Name:   Carl Molina Date of Exam: 09/29/2013  Medical Rec #:  [id number]         Custom1:  Date of Birth:  May 22, 1929      Height:       69.0 in  Patient Age:    81 years       Weight:       231.0 lb  Patient Gender: M              BSA:          2.20 m??    Indications: Atrial Fib  Sonographer:    Janalee Dane RCS  Referring Phys: Hillary Bow, R    Summary:   1. Left ventricular ejection fraction, by visual estimation, is 35 to   40%.   2. Moderately decreased global left ventricular systolic function.   3. Mild to moderately  increased left ventricular internal cavity size.   4. Severely enlarged right ventricle.   5. Moderately dilated left atrium.   6. Mildly dilated right atrium.   7. Mild mitral valve regurgitation.   8. Mildly elevated pulmonary artery systolic pressure.   9. Mild to moderate tricuspid regurgitation.  2D AND M-MODE MEASUREMENTS (normal ranges within parentheses):  Left Ventricle:          Normal  IVSd (2D):      1.12 cm (0.7-1.1)  LVPWd (2D):     0.99 cm (0.7-1.1) Aorta/LA:  Normal  LVIDd (2D):     5.55 cm (3.4-5.7) Aortic Root (2D): 3.60 cm (2.4-3.7)  LVIDs (2D):     4.67 cm           Left Atrium (2D): 5.20 cm (1.9-4.0)  LV FS (2D):     15.9 %   (>25%)  LV EF (2D):     33.0 %   (>50%)                                    Right Ventricle:                                    RVd (2D):        6.10 cm  SPECTRAL DOPPLER ANALYSIS (where applicable):  Tricuspid Valve and PA/RV Systolic Pressure: TR Max Velocity: 2.26 m/s RA   Pressure: 20 mmHg RVSP/PASP: 40.5 mmHg    PHYSICIAN INTERPRETATION:  Left Ventricle: The left ventricular internal cavity size was mild to   moderately increased. LV septal wall thickness was normal. LV posterior     wall thickness was normal. Global LV systolic function was moderately   decreased. Left ventricular ejection fraction, by visual estimation, is   35 to 40%.  Right Ventricle: The right ventricular size is severely enlarged.  Left Atrium: The left atrium is moderately dilated.  Right Atrium: The right atrium is mildly dilated.  Pericardium: There is no evidence of pericardial effusion.  Mitral Valve: The mitral valve is normal in structure. Mild mitral valve   regurgitation is seen.  Tricuspid Valve: The tricuspid valve is normal. Mild to moderate   tricuspid regurgitation is visualized. The tricuspid regurgitant velocity   is 2.26 m/s, and with an assumed right atrial pressure of 20 mmHg, the   estimated right ventricular systolic  pressure is mildly elevated at 40.5   mmHg.  Aortic Valve: The aortic valve is normal. The aortic valve is     structurally normal, with no evidence of sclerosis or stenosis. No   evidence of aortic valve regurgitation is seen.  Pulmonic Valve: The pulmonic valve is normal.    [place] MD  Electronically signed by [place] MD  Signature Date/Time: 09/30/2013/2:13:13 PM    *** Final ***    IMPRESSION: .        Verified By: Yolonda Kida, M.D., MD  CT:    08-Jul-15 11:27, CT Head Without Contrast  CT Head Without Contrast   REASON FOR EXAM:    encephalopathy  COMMENTS:       PROCEDURE: CT  - CT HEAD WITHOUT CONTRAST  - Sep 29 2013 11:27AM     CLINICAL DATA:  Encephalopathy.    EXAM:  CT HEAD WITHOUT CONTRAST    TECHNIQUE:  Contiguous axial images were obtained from the base of the skull  through the vertex without intravenous contrast.    COMPARISON:  Brain MRI 10/18/2011.  Head CT scan 05/06/2011.  FINDINGS:  Atrophy and chronic microvascular ischemic change are again seen.  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 No evidence  of acute abnormality including infarct, hemorrhage, mass lesion,  mass effect, midline shift or abnormal extra-axial fluid collection  is present. There is no pneumocephalus or hydrocephalus. Imaged  paranasal sinuses and mastoid air cells are clear. The calvarium is  intact.  IMPRESSION:  No acute finding.  Stable compared to prior exams.      Electronically Signed    By: Inge Rise M.D.    On: 09/29/2013 11:47         Verified By: Ramond Dial, M.D.,   Assessment/Plan:  Invasive Device Daily Assessment of Necessity:  Does the patient currently have any of the following indwelling devices? foley   Assessment/Plan:  Assessment IMP UTI sepsis Hypotension Bradycardia Hypoglycemia Paraplegia AFIB Overdose CRI Hx MI .   Plan PLAN Tele Antibx Agree with endocrine  input Bradycardia much improved Anticoug Continue hydration Consider urology input o replace foley I do not rec PPM Hopefully d/c home soon [facility]   Electronic Signatures: Lujean Amel D (MD)  (Signed 19-Jul-15 21:42)  Authored: Chief Complaint, VITAL SIGNS/ANCILLARY NOTES, Brief Assessment, Lab Results, Radiology Results, Assessment/Plan   Last Updated: 19-Jul-15 21:42 by Lujean Amel D (MD)

## 2014-07-16 NOTE — Consult Note (Signed)
Brief Consult Note: Diagnosis: Bradycardia/AFIB/OD-bblocker.   Patient was seen by consultant.   Consult note dictated.   Recommend further assessment or treatment.   Orders entered.   Discussed with Attending MD.   Comments: IMP Bradycardia AFIB Hypotension Hyponatremia AMS Hypoglycemia right eye b-blocker Paraplegia . PLAN IV hydration Atropine prn severe bardy/hypotension HR<30 SBP<90 Hold off on temp pacer for now Supportive care Correct Na Maintain Glucose ICU care until b-blocker wears off. Consider ECHO Continue coumadin for AFIB Conservative supportive care for now.  Electronic Signatures: Dorothyann Pengallwood, Irven Ingalsbe D (MD)  (Signed 08-Jul-15 10:50)  Authored: Brief Consult Note   Last Updated: 08-Jul-15 10:50 by Dorothyann Pengallwood, Niasha Devins D (MD)

## 2014-07-16 NOTE — Consult Note (Signed)
Chief Complaint:  Subjective/Chief Complaint The much more awake today   alert oriented answering questions denies any pain or shortness of breath.   VITAL SIGNS/ANCILLARY NOTES: **Vital Signs.:   13-Oct-15 11:15  Vital Signs Type Routine  Temperature Temperature (F) 97.3  Celsius 36.2  Temperature Source oral  Pulse Pulse 67  Respirations Respirations 19  Systolic BP Systolic BP 831  Diastolic BP (mmHg) Diastolic BP (mmHg) 76  Mean BP 93  Pulse Ox % Pulse Ox % 97  Pulse Ox Activity Level  At rest  Oxygen Delivery 3L  *Intake and Output.:   13-Oct-15 09:27  Grand Totals Intake:   Output:  1300    Net:  -1300 24 Hr.:  -1060  Urine ml     Out:  1300  Urinary Method  Suprapubic Catheter    17:15  Percentage of Meal Eaten  100   Brief Assessment:  GEN well developed, well nourished, no acute distress   Cardiac Regular  Irregular  + carotid bruits  -- JVD   Respiratory normal resp effort   Gastrointestinal Normal   Gastrointestinal details normal Soft  Nontender  Nondistended  No masses palpable   EXTR negative cyanosis/clubbing, negative edema   Lab Results: Hepatic:  12-Oct-15 17:15   Bilirubin, Total  1.5  Bilirubin, Direct  0.7 (Result(s) reported on 03 Jan 2014 at 05:59PM.)  Alkaline Phosphatase 106 (46-116 NOTE: New Reference Range 10/12/13)  SGPT (ALT) 33 (14-63 NOTE: New Reference Range 10/12/13)  SGOT (AST)  47  Total Protein, Serum  6.3  Albumin, Serum 3.4  Lab:  12-Oct-15 15:07   pH (ABG) 7.410 (7.350-7.450 NOTE: New Reference Range 10/16/13)  PCO2 37 (32-48 NOTE: New Reference Range 11/02/13)  PO2 84 (83-108 NOTE: New Reference Range 10/16/13)  FiO2 28  Base Excess -1 (-3-3 NOTE: New Reference Range 11/02/13)  HCO3 23.5 (21.0-28.0 NOTE: New Reference Range 10/16/13)  O2 Saturation 96.5  O2 Device [place]  Specimen Site (ABG) RT RADIAL  Specimen Type (ABG) ARTERIAL  Patient Temp (ABG) 37.0 (Result(s) reported on 03 Jan 2014 at 03:13PM.)   Routine Chem:  12-Oct-15 04:12   Glucose, Serum 96  BUN 15  Creatinine (comp) 1.06  Sodium, Serum  121  Potassium, Serum 3.7  Chloride, Serum  86  CO2, Serum 25  Calcium (Total), Serum 8.8  Anion Gap 10  Osmolality (calc) 245  eGFR (African American) >60  eGFR (Non-African American) >60 (eGFR values <36m/min/1.73 m2 may be an indication of chronic kidney disease (CKD). Calculated eGFR, using the MRDR Study equation, is useful in  patients with stable renal function. The eGFR calculation will not be reliable in acutely ill patients when serum creatinine is changing rapidly. It is not useful in patients on dialysis. The eGFR calculation may not be applicable to patients at the low and high extremes of body sizes, pregnant women, and vetetarians.)    17:15   Magnesium, Serum  1.7 (1.8-2.4 THERAPEUTIC RANGE: 4-7 mg/dL TOXIC: > 10 mg/dL  -----------------------)  Glucose, Serum  100  BUN 16  Creatinine (comp) 1.00  Sodium, Serum  123  Potassium, Serum 3.8  Chloride, Serum  88  CO2, Serum 24  Calcium (Total), Serum 8.6  Anion Gap 11  Osmolality (calc) 249  eGFR (African American) >60  eGFR (Non-African American) >60 (eGFR values <679mmin/1.73 m2 may be an indication of chronic kidney disease (CKD). Calculated eGFR, using the MRDR Study equation, is useful in  patients with stable renal function. The eGFR calculation  will not be reliable in acutely ill patients when serum creatinine is changing rapidly. It is not useful in patients on dialysis. The eGFR calculation may not be applicable to patients at the low and high extremes of body sizes, pregnant women, and vetetarians.)  Ammonia, Plasma  36 (Result(s) reported on 03 Jan 2014 at [facility].)  13-Oct-15 04:00   Glucose, Serum  102  BUN 16  Creatinine (comp) 1.13  Sodium, Serum  125  Potassium, Serum 4.1  Chloride, Serum  87  CO2, Serum 28  Calcium (Total), Serum 9.0  Anion Gap 10  Osmolality (calc) 253  eGFR  (African American) >60  eGFR (Non-African American) >60 (eGFR values <57m/min/1.73 m2 may be an indication of chronic kidney disease (CKD). Calculated eGFR, using the MRDR Study equation, is useful in  patients with stable renal function. The eGFR calculation will not be reliable in acutely ill patients when serum creatinine is changing rapidly. It is not useful in patients on dialysis. The eGFR calculation may not be applicable to patients at the low and high extremes of body sizes, pregnant women, and vetetarians.)    15:48   Glucose, Serum  130  BUN 17  Creatinine (comp) 1.20  Sodium, Serum  126  Potassium, Serum 3.8  Chloride, Serum  89  CO2, Serum 28  Calcium (Total), Serum 8.7  Anion Gap 9  Osmolality (calc) 257  eGFR (African American) >60  eGFR (Non-African American) >60 (eGFR values <697mmin/1.73 m2 may be an indication of chronic kidney disease (CKD). Calculated eGFR, using the MRDR Study equation, is useful in  patients with stable renal function. The eGFR calculation will not be reliable in acutely ill patients when serum creatinine is changing rapidly. It is not useful in patients on dialysis. The eGFR calculation may not be applicable to patients at the low and high extremes of body sizes, pregnant women, and vetetarians.)  Cardiac:  12-Oct-15 00:25   CPK-MB, Serum  10.6 (Result(s) reported on 03 Jan 2014 at 01:41AM.)  Troponin I 0.04 (0.00-0.05 0.05 ng/mL or less: NEGATIVE  Repeat testing in 3-6 hrs  if clinically indicated. >0.05 ng/mL: POTENTIAL  MYOCARDIAL INJURY. Repeat  testing in 3-6 hrs if  clinically indicated. NOTE: An increase or decrease  of 30% or more on serial  testing suggests a  clinically important change)    04:12   CPK-MB, Serum  12.1 (Result(s) reported on 03 Jan 2014 at 05:00AM.)  Troponin I 0.04 (0.00-0.05 0.05 ng/mL or less: NEGATIVE  Repeat testing in 3-6 hrs  if clinically indicated. >0.05 ng/mL: POTENTIAL  MYOCARDIAL  INJURY. Repeat  testing in 3-6 hrs if  clinically indicated. NOTE: An increase or decrease  of 30% or more on serial  testing suggests a  clinically important change)    17:15   CK, Total 292 (39-308 NOTE: NEW REFERENCE RANGE  04/26/2013)  CPK-MB, Serum  10.2 (Result(s) reported on 03 Jan 2014 at 05:59PM.)  Routine Coag:  12-Oct-15 04:12   Prothrombin  30.9  INR 3.1 (INR reference interval applies to patients on anticoagulant therapy. A single INR therapeutic range for coumarins is not optimal for all indications; however, the suggested range for most indications is 2.0 - 3.0. Exceptions to the INR Reference Range may include: Prosthetic heart valves, acute myocardial infarction, prevention of myocardial infarction, and combinations of aspirin and anticoagulant. The need for a higher or lower target INR must be assessed individually. Reference: The Pharmacology and Management of the Vitamin K  antagonists: the  seventh ACCP Conference on Antithrombotic and Thrombolytic Therapy. LKGMW.1027 Sept:126 (3suppl): N9146842. A HCT value >55% may artifactually increase the PT.  In one study,  the increase was an average of 25%. Reference:  "Effect on Routine and Special Coagulation Testing Values of Citrate Anticoagulant Adjustment in Patients with High HCT Values." American Journal of Clinical Pathology 2006;126:400-405.)  13-Oct-15 04:00   Prothrombin  30.3  INR 3.0 (INR reference interval applies to patients on anticoagulant therapy. A single INR therapeutic range for coumarins is not optimal for all indications; however, the suggested range for most indications is 2.0 - 3.0. Exceptions to the INR Reference Range may include: Prosthetic heart valves, acute myocardial infarction, prevention of myocardial infarction, and combinations of aspirin and anticoagulant. The need for a higher or lower target INR must be assessed individually. Reference: The Pharmacology and Management of the  Vitamin K  antagonists: the seventh ACCP Conference on Antithrombotic and Thrombolytic Therapy. OZDGU.4403 Sept:126 (3suppl): N9146842. A HCT value >55% may artifactually increase the PT.  In one study,  the increase was an average of 25%. Reference:  "Effect on Routine and Special Coagulation Testing Values of Citrate Anticoagulant Adjustment in Patients with High HCT Values." American Journal of Clinical Pathology 2006;126:400-405.)  Routine Hem:  13-Oct-15 04:00   WBC (CBC)  3.4  RBC (CBC) 4.51  Hemoglobin (CBC) 14.6  Hematocrit (CBC) 45.5  Platelet Count (CBC)  86  MCV  101  MCH 32.4  MCHC 32.1  RDW 13.4  Neutrophil % 67.6  Lymphocyte % 13.0  Monocyte % 15.3  Eosinophil % 3.5  Basophil % 0.6  Neutrophil # 2.3  Lymphocyte #  0.4  Monocyte # 0.5  Eosinophil # 0.1  Basophil # 0.0 (Result(s) reported on 04 Jan 2014 at 04:57AM.)   Radiology Results: XRay:    11-Oct-15 20:15, Chest PA and Lateral  Chest PA and Lateral   REASON FOR EXAM:    sob  COMMENTS:       PROCEDURE: DXR - DXR CHEST PA (OR AP) AND LATERAL  - Jan 02 2014  8:15PM     CLINICAL DATA:  Shortness of breath.  Chest congestion.    EXAM:  CHEST  2 VIEW    COMPARISON:  09/30/2013    FINDINGS:  Moderate cardiomegaly is stable. Both lungs are clear. No evidence  of pleural effusion. Ectasia of the thoracic aorta is stable.   IMPRESSION:  Stable cardiomegaly.  No active lung disease.      Electronically Signed    By: Earle Gell M.D.    On: 01/02/2014 20:31         Verified By: Marlaine Hind, M.D.,    12-Oct-15 17:58, Chest Portable Single View  Chest Portable Single View   REASON FOR EXAM:    hypoxia  COMMENTS:       PROCEDURE: DXR - DXR PORTABLE CHEST SINGLE VIEW  - Jan 03 2014  5:58PM     CLINICAL DATA:  81 year old male with hypoxia. Subsequent encounter.    EXAM:  PORTABLE CHEST - 1 VIEW    COMPARISON:  01/02/2014 and 10/17/2011.    FINDINGS:  Cardiomegaly.  Calcified tortuous  aorta.    Central pulmonary vascular prominence without frank pulmonary edema.    No segmental consolidation or gross pneumothorax.     IMPRESSION:  Cardiomegaly.    Central pulmonary vascular prominence.    Calcified tortuous aorta.      Electronically Signed    By: Mignon Pine.D.  On: 01/03/2014 18:04         Verified By: Doug Sou, M.D.,  Cardiology:    11-Oct-15 19:26, ED ECG  Ventricular Rate 55  Atrial Rate 56  QRS Duration 112  QT 434  QTc 415  R Axis -15  T Axis -55  ECG interpretation   Junctional rhythm  Incomplete right bundle branch block  Inferior infarct (cited on or before 06-Sep-2004)  Anterior infarct (cited on or before 04-Jul-2009)  Abnormal ECG  When compared with ECG of 28-Sep-2013 15:22,  Previous ECG has undetermined rhythm, needs review  Incomplete right bundle branch block is now Present  ----------unconfirmed----------  Confirmed by OVERREAD, NOT ([id number]), editor PEARSON, BARBARA ([id number]) on 01/03/2014 12:45:41 PM  ED ECG   CT:    12-Oct-15 17:55, CT Head Without Contrast  CT Head Without Contrast   REASON FOR EXAM:    altered mental status  COMMENTS:       PROCEDURE: CT  - CT HEAD WITHOUT CONTRAST  - Jan 03 2014  5:55PM     CLINICAL DATA:  Syncope versus seizure, drowsy, drooling    EXAM:  CT HEAD WITHOUT CONTRAST    TECHNIQUE:  Contiguous axial images were obtained from the base of the skull  through the vertex without intravenous contrast.    COMPARISON:  09/29/2013  FINDINGS:  There is no evidence of mass effect, midline shift, or extra-axial  fluid collections. There is no evidence of aspace-occupying lesion  or intracranial hemorrhage. There is no evidence of a cortical-based  area of acute infarction. There is an old right frontal lobe  infarct. There is generalized cerebral atrophy. There is  periventricular white matter low attenuation likely secondary to  microangiopathy.    The ventricles and sulci are  appropriate for the patient's age. The  basal cisterns are patent.    Visualized portions of the orbits are unremarkable. The visualized  portions of the paranasal sinuses and mastoid air cells are  unremarkable. Cerebrovascular atherosclerotic calcifications are  noted.    The osseous structures are unremarkable.     IMPRESSION:  No acute intracranial pathology.      Electronically Signed    By: Kathreen Devoid    On: 01/03/2014 18:05         Verified By: Jennette Banker, M.D., MD   Assessment/Plan:  Assessment/Plan:  Assessment IMP  altered mental status  hyponatremia  congestive heart failure  cardiomyopathy   urinary tract infection   neurogenic bladder  bradycardia  paraplegia .   Plan PLAN  continued to increase and improve sodium today 123  continue diuretic therapy  agree with antibiotics for UTI  continue Foley for neurogenic bladder  altered mental status her improved  continue current therapy for bradycardia  continue supportive care for paraplegia  agree with transferred to the [place]: Yolonda Kida (MD)  (Signed 14-Oct-15 11:15)  Authored: Chief Complaint, VITAL SIGNS/ANCILLARY NOTES, Brief Assessment, Lab Results, Radiology Results, Assessment/Plan   Last Updated: 14-Oct-15 11:15 by Yolonda Kida (MD)

## 2014-07-16 NOTE — Consult Note (Signed)
Chief Complaint:  Subjective/Chief Complaint Feels well today except for chronic pain .No cp no sob . Sugars slightly better now.   VITAL SIGNS/ANCILLARY NOTES: **Vital Signs.:   14-Jul-15 11:36  Vital Signs Type Routine  Temperature Temperature (F) 97.4  Celsius 36.3  Temperature Source oral  Pulse Pulse 47  Respirations Respirations 18  Systolic BP Systolic BP 157  Diastolic BP (mmHg) Diastolic BP (mmHg) 79  Mean BP 105  Pulse Ox % Pulse Ox % 100  Pulse Ox Activity Level  At rest  Oxygen Delivery Room Air/ 21 %  *Intake and Output.:   14-Jul-15 11:15  Grand Totals Intake:  240 Output:      Net:  240 24 Hr.:  161  Oral Intake      In:  240  Percentage of Meal Eaten  100   Brief Assessment:  (Data referenced from "Consultant's Daily Progress Note" 13-Jul-15 15:22)  GEN well developed, well nourished, no acute distress   Cardiac Regular  murmur present   Respiratory normal resp effort  clear BS   Gastrointestinal Normal   Gastrointestinal details normal Soft  Bowel sounds normal   EXTR paraplegia   Lab Results: General Ref:  14-Jul-15 19:40   Cortisol, Serum RIA ========== TEST NAME ==========  ========= RESULTS =========  = REFERENCE RANGE =  CORTISOL  Cortisol Cortisol                        [   14.8 ug/dL           ]          0.4-54.02.3-19.4                                        Cortisol AM         6.2 - 19.4                                        Cortisol PM         2.3 - 11.9               [facility]            No: [id number]           [address], [place], [place] [id number]           Mila HomerWilliam F Hancock, MD         [phone]   Result(s) reported on 06 Oct 2013 at 03:49AM.  Insulin + C-Peptide ========== TEST NAME ==========  ========= RESULTS =========  = REFERENCE RANGE =  INSULIN + C-PEPTIDE  Insulin and C-Peptide, Serum Insulin                         [H  45.5 uIU/mL          ]          2.6-24.9 C-Peptide, Serum                [H 7.9  ng/mL            ]           1.1-4.4 C-Peptide reference interval is for fasting patients.  [facility]            No: [id number]           [address], [place], [place] [address]           Mila Homer, MD    [phone]   Result(s) reported on 06 Oct 2013 at 02:51PM.  Routine Chem:  14-Jul-15 04:14   Result Comment LABS - This specimen was collected through an   - indwelling catheter or arterial line.  - A minimum of of blood was wasted prior    - to collecting the sample.  Interpret  - results with caution.  Result(s) reported on 05 Oct 2013 at 04:40AM.    19:40   Glucose, Serum  139 (Result(s) reported on 05 Oct 2013 at 07:14PM.)  Routine Coag:  14-Jul-15 04:14   Prothrombin  27.5  INR 2.6 (INR reference interval applies to patients on anticoagulant therapy. A single INR therapeutic range for coumarins is not optimal for all indications; however, the suggested range for most indications is 2.0 - 3.0. Exceptions to the INR Reference Range may include: Prosthetic heart valves, acute myocardial infarction, prevention of myocardial infarction, and combinations of aspirin and anticoagulant. The need for a higher or lower target INR must be assessed individually. Reference: The Pharmacology and Management of the Vitamin K  antagonists: the seventh ACCP Conference on Antithrombotic and Thrombolytic Therapy. Chest.2004 Sept:126 (3suppl): [id number]. A HCT value >55% may artifactually increase the PT.  In one study,  the increase was an average of 25%. Reference:  "Effect on Routine and Special Coagulation Testing Values of Citrate Anticoagulant Adjustment in Patients with High HCT Values." American Journal of Clinical Pathology 2006;126:400-405.)   Assessment/Plan:  Invasive Device Daily Assessment of Necessity:  (Data referenced from "Consultant's Daily Progress Note" 13-Jul-15 15:22)  Does the patient currently have any of the following  indwelling devices? foley   Assessment/Plan:  Assessment IMP Hx paraplegia UTI sepsis Hypotension Bradycardia Hypoglycemia Paraplegia AFIB Overdose CRI Hx MI .   Plan PLAN Hopefully d/c home soon Avoid AV nodal blocking drugs Tele Antibx Agree with endocrine input Bradycardia much improved Anticoug Continue hydration Consider urology input o replace foley I do not rec PPM Hopefully d/c home soon [facility]   Electronic Signatures: Dorothyann Peng D (MD)  (Signed 19-Jul-15 21:45)  Authored: Chief Complaint, VITAL SIGNS/ANCILLARY NOTES, Brief Assessment, Lab Results, Assessment/Plan   Last Updated: 19-Jul-15 21:45 by Dorothyann Peng D (MD)

## 2014-07-16 NOTE — H&P (Signed)
PATIENT NAME:  Carl Molina, Carl E MR#:  [id number] DATE OF BIRTH:  08-16-1929  DATE OF ADMISSION:  01/02/2014  REFERRING PHYSICIAN: Dr. Mayford KnifeWilliams.   PRIMARY CARE PHYSICIAN: Previously followed with Dr. Kandyce RudMarcus Babaoff and cardiologist, Dr. Dorothyann Pengwayne Callwood.   CHIEF COMPLAINT: Confusion and shortness of breath.  HISTORY OF PRESENT ILLNESS:  An 81 year old African American gentleman with a history of paraplegia since 2003 with neurogenic bladder requiring indwelling suprapubic catheter as well as systolic congestive heart failure, ejection fraction 35%, presented with shortness of breath and confusion. Per the family who are at present at bedside state that he has been more confused and drowsy over the last 2 to 3 days' duration. They also noticed the shortness of breath, mainly when lying flat and worsening lower extremity edema. The patient denies any cough, chest pain, fevers, or further symptomatology. He does attest to being somewhat more confused lately, however, he did respond appropriately.  He does note to having Lasix decreased from 40 mg to 20 mg recently, however he is unsure of when this change actually took place.  On the ER work-up noted to be hyponatremic with sodium of 119.   REVIEW OF SYSTEMS:  CONSTITUTIONAL: Denies fever. Positive for generalized weakness, fatigue.  EYES: Denies blurred vision, double vision, eye pain.  EARS, NOSE, THROAT: Denies tinnitus, ear pain, or hearing loss.  RESPIRATORY: Denies cough, wheeze. Positive shortness of breath.  CARDIOVASCULAR: Denies chest pain, positive for orthopnea as well as edema.  GASTROINTESTINAL: Denies nausea, vomiting, diarrhea, abdominal pain.  GENITOURINARY: Denies dysuria, hematuria, nocturia or thyroid problems.  HEMATOLOGY: Denies easy bruising or bleeding.  SKIN: Denies rashes or lesions.  MUSCULOSKELETAL: Denies pain in neck, back, shoulder, knees, hips or arthritic symptoms.  NEUROLOGIC: Denies paralysis, paresthesias.   PSYCHIATRIC: Denies anxiety or depressive symptoms. Otherwise full review of systems performed by me is negative.   PAST MEDICAL HISTORY: Paraplegia since 2003, coronary artery disease without stent placement, hyperlipidemia, hypertension, paroxysmal atrial fibrillation, BPH, neurogenic bladder requiring suprapubic catheterization, systolic congestive heart failure, ejection fraction 35%.   SOCIAL HISTORY: No alcohol, tobacco, or drug usage. Wheelchair bound.   FAMILY HISTORY: No known cardiovascular or pulmonary disorders.   ALLERGIES: No known drug allergies.   HOME MEDICATIONS: Acetaminophen 325 mg 2 tablets p.o. 3 times a day as needed for pain, Restoril 20 mg p.o. daily, Warfarin 3 mg p.o. at bedtime, Lyrica 100 mg p.o. 3 times daily, nortriptyline 10 mg 2 capsules p.o. at bedtime, hydroxyzine 10 mg p.o. twice a day as needed for itching, Senna-S 50/8.6 mg 2 tabs p.o. at bedtime, magnesium 4 mg 2 tabs p.o. daily, baclofen 10 mg half tablet p.o. in the morning and 1 tablet in the evening, methocarbamol 750 mg 2 tablets p.o. 3 times daily, ciprofloxacin 500 mg p.o. twice a day. He states that he is also taking Lasix 20 mg p.o. daily. However, this is not in his medication record.   PHYSICAL EXAMINATION: VITAL SIGNS: Temperature 98.2, heart rate of 55, respirations 20, blood pressure 191/98, saturating 99% on room air, weight 99.8 kg, BMI of 32.5.  GENERAL: Weak-appearing African American gentleman, currently in no acute distress.  HEAD: Normocephalic, atraumatic.  EYES: Pupils equal, round, reactive to light. Extraocular muscles intact. No scleral icterus.  MOUTH: Moist mucosal membranes. Dentition poor. No abscess noted.  EAR/NOSE/THROAT: Clear without exudates. No external lesions.  NECK: Supple. No thyromegaly. No nodules. No JVD.  PULMONARY: Diminished breath sounds secondary to poor respiratory effort, however, no wheezes, rales, or  rhonchi. No use of accessory muscles. Poor respiratory  effort as already stated.  CHEST: Nontender to palpation.  CARDIOVASCULAR: S1 and S2, bradycardic, no murmurs, rubs, or gallops, 2+ pedal edema with 3+ sacral edema. Pedal pulses 2+ bilaterally. ABDOMEN:  Soft, nontender, nondistended. No masses. Positive bowel sounds. No hepatosplenomegaly.  MUSCULOSKELETAL: No swelling or clubbing. Positive for edema as described above. He is a paraplegic, however does have passive range of motion.  Upper extremities have full range of motion.  NEUROLOGIC: Cranial nerves II through XII intact. He is paralyzed from the waist down.  SKIN: No ulceration, lesions, rash, cyanosis. Skin warm, dry. Skin turgor intact.   PSYCHIATRIC: Mood, affect blunted. He is, however awake, alert, oriented x 3. Insight and judgment appear to be intact.   LABORATORY DATA: EKG performed revealing sinus bradycardia, No ST or T wave abnormalities. Sodium 119, potassium 4.2, chloride 83, bicarbonate 25, BUN 15, creatinine 1.17, glucose 93. BNP 5561, troponin 0.05, WBC 3.7, hemoglobin 14.9, platelets of 84,000, INR 3.1. Urinalysis: WBC 58, RBCs 20, leukocyte esterase 3+. Chest x-ray performed revealing cardiomegaly, however no active lung disease.   ASSESSMENT AND PLAN: An 81 year old gentleman with history of paraplegia with neurogenic bladder and systolic congestive heart failure, presented with shortness of breath and confusion.  1. Acute metabolic encephalopathy: In relation to hyponatremia versus urinary tract infection.  2. Hyponatremia and volume overload state: Continue with diuresis. Follow sodium level if not improved.  He will require hypertonic saline with continued diuresis.  3. Acute on chronic systolic congestive heart failure: Ejection fraction 35%. We will consult cardiology. He follows with Dr. Juliann Pares.  We will continue with diuresis, follow ins and outs, and urine output.  4. Urinary tract infection: Ceftriaxone.  Follow culture data. 5. Atrial fibrillation paroxysmal:  Currently in normal sinus rhythm. Continue warfarin for anticoagulation.  6. Venous thromboembolism prophylaxis: With warfarin.  The patient is FULL CODE.   TIME SPENT: 45 minutes.    ____________________________ Cletis Athens. Hower, MD dkh:hh D: 01/02/2014 22:33:02 ET T: 01/03/2014 00:01:00 ET JOB#: [id number]  cc: Cletis Athens. Hower, MD, <Dictator> DAVID Synetta Shadow MD ELECTRONICALLY SIGNED 01/04/2014 0:44

## 2014-07-16 NOTE — Consult Note (Signed)
Chief Complaint:  Subjective/Chief Complaint Pt seems to be doing much better today reduced weakness and fatigue . Denies palps.   VITAL SIGNS/ANCILLARY NOTES: *Intake and Output.:   Daily 11-Jul-15 07:00  Grand Totals Intake:  1335.8 Output:  3900    Net:  -2564.2 24 Hr.:  -2564.2  Oral Intake      In:  120  Dopamine      In:  15.8  IV (Primary)      In:  1150  IV (Secondary)      In:  50  Urine ml     Out:  3900  Length of Stay Totals Intake:  6102.3 Output:  8700    Net:  -2597.7   Brief Assessment:  GEN well developed, well nourished, no acute distress, obese   Cardiac Irregular  -- LE edema   Respiratory normal resp effort  rhonchi   Gastrointestinal Normal   Gastrointestinal details normal Soft   EXTR positive edema   Radiology Results: XRay:    09-Jul-15 09:01, Chest Portable Single View  Chest Portable Single View   REASON FOR EXAM:    hypotension. SOB  COMMENTS:       PROCEDURE: DXR - DXR PORTABLE CHEST SINGLE VIEW  - Sep 30 2013  9:01AM     CLINICAL DATA:  Hypotension and shortness of breath    EXAM:  PORTABLE CHEST - 1 VIEW    COMPARISON:  October 18, 2011    FINDINGS:  There is underlying emphysematous change. There is atelectatic  change in the right mid lung. There is no frank edema or  consolidation. There is cardiomegaly. The pulmonary vascularity is  within normal limits. No adenopathy. There is atherosclerotic change  in aorta.     IMPRESSION:  Stable cardiomegaly. Underlying emphysematous change. No appreciable  edema or consolidation. There is mild atelectatic change in the  right mid lung.      Electronically Signed    By: Bretta BangWilliam  Woodruff M.D.    On: 09/30/2013 09:03       Verified By: Rutherford GuysWILLIAM W. Margarita GrizzleWOODRUFF, M.D.,    09-Jul-15 14:22, Chest Portable Single View  Chest Portable Single View   REASON FOR EXAM:    CENTRAL LINE PLACEMENT  COMMENTS:       PROCEDURE: DXR - DXR PORTABLE CHEST SINGLE VIEW  - Sep 30 2013  2:22PM      CLINICAL DATA:  Central line placement    EXAM:  PORTABLE CHEST - 1 VIEW    COMPARISON:  09/30/2013    FINDINGS:  Right jugular central venous catheter in the mid SVC. Negative for  pneumothorax.  Cardiac enlargement without heart failure or pneumonia.     IMPRESSION:  Satisfactory central line placement.      Electronically Signed    By: Marlan Palauharles  Clark M.D.    On: 09/30/2013 14:30         Verified By: Camelia PhenesAVID C. CLARK, M.D.,  Cardiology:    07-Jul-15 15:22, ED ECG  Ventricular Rate 40  Atrial Rate 36  QRS Duration 118  QT 514  QTc 418  R Axis -31  T Axis 133  ECG interpretation   Undetermined rhythm  Left axis deviation  Inferior infarct (cited on or before 06-Sep-2004)  Anterior infarct (cited on or before 04-Jul-2009)  *** ** ** ** * ACUTE MI  ** ** ** **  Consider right ventricular involvement in acute inferior infarct  Abnormal ECG  When compared with ECG of 04-Dec-2011  11:49,  Current undetermined rhythm precludes rhythm comparison, needs review  QRS duration has increased  Serial changes of Inferior infarct Present  ----------unconfirmed----------  Confirmed by OVERREAD, NOT ([id number]), editor PEARSON, BARBARA ([id number]) on 09/30/2013 2:53:55 PM  ED ECG     08-Jul-15 18:18, Echo Doppler  Echo Doppler   REASON FOR EXAM:      COMMENTS:       PROCEDURE: [facility] - ECHO DOPPLER COMPLETE(TRANSTHOR)  - Sep 29 2013  6:18PM     RESULT: Echocardiogram Report    Patient Name:   Carl Molina Date of Exam: 09/29/2013  Medical Rec #:  [id number]         Custom1:  Date of Birth:  Oct 09, 1929      Height:       69.0 in  Patient Age:    81 years       Weight:       231.0 lb  Patient Gender: M              BSA:          2.20 m??    Indications: Atrial Fib  Sonographer:    Nestor Ramp RCS  Referring Phys: Milagros Loll, R    Summary:   1. Left ventricular ejection fraction, by visual estimation, is 35 to   40%.   2. Moderately decreased global left ventricular systolic  function.   3. Mild to moderately increased left ventricular internal cavity size.   4. Severely enlarged right ventricle.   5. Moderately dilated left atrium.   6. Mildly dilated right atrium.   7. Mild mitral valve regurgitation.   8. Mildly elevated pulmonary artery systolic pressure.   9. Mild to moderate tricuspid regurgitation.  2D AND M-MODE MEASUREMENTS (normal ranges within parentheses):  Left Ventricle:          Normal  IVSd (2D):      1.12 cm (0.7-1.1)  LVPWd (2D):     0.99 cm (0.7-1.1) Aorta/LA:                  Normal  LVIDd (2D):     5.55 cm (3.4-5.7) Aortic Root (2D): 3.60 cm (2.4-3.7)  LVIDs (2D):     4.67 cm           Left Atrium (2D): 5.20 cm (1.9-4.0)  LV FS (2D):     15.9 %   (>25%)  LV EF (2D):     33.0 %   (>50%)                                    Right Ventricle:                                    RVd (2D):        6.10 cm  SPECTRAL DOPPLER ANALYSIS (where applicable):  Tricuspid Valve and PA/RV Systolic Pressure: TR Max Velocity: 2.26 m/s RA   Pressure: 20 mmHg RVSP/PASP: 40.5 mmHg    PHYSICIAN INTERPRETATION:  Left Ventricle: The left ventricular internal cavity size was mild to   moderately increased. LV septal wall thickness was normal. LV posterior     wall thickness was normal. Global LV systolic function was moderately   decreased. Left ventricular ejection fraction, by visual estimation, is   35 to 40%.  Right Ventricle:  The right ventricular size is severely enlarged.  Left Atrium: The left atrium is moderately dilated.  Right Atrium: The right atrium is mildly dilated.  Pericardium: There is no evidence of pericardial effusion.  Mitral Valve: The mitral valve is normal in structure. Mild mitral valve   regurgitation is seen.  Tricuspid Valve: The tricuspid valve is normal. Mild to moderate   tricuspid regurgitation is visualized. The tricuspid regurgitant velocity   is 2.26 m/s, and with an assumed right atrial pressure of 20 mmHg, the   estimated  right ventricular systolic pressure is mildly elevated at 40.5   mmHg.  Aortic Valve: The aortic valve is normal. The aortic valve is     structurally normal, with no evidence of sclerosis or stenosis. No   evidence of aortic valve regurgitation is seen.  Pulmonic Valve: The pulmonic valve is normal.    [id number] Dwayne Callwood MD  Electronically signed by [id number] Dorothyann Peng MD  Signature Date/Time: 09/30/2013/2:13:13 PM    *** Final ***    IMPRESSION: .        Verified By: Alwyn Pea, M.D., MD  CT:    08-Jul-15 11:27, CT Head Without Contrast  CT Head Without Contrast   REASON FOR EXAM:    encephalopathy  COMMENTS:       PROCEDURE: CT  - CT HEAD WITHOUT CONTRAST  - Sep 29 2013 11:27AM     CLINICAL DATA:  Encephalopathy.    EXAM:  CT HEAD WITHOUT CONTRAST    TECHNIQUE:  Contiguous axial images were obtained from the base of the skull  through the vertex without intravenous contrast.    COMPARISON:  Brain MRI 10/18/2011.  Head CT scan 05/06/2011.  FINDINGS:  Atrophy and chronic microvascular ischemic change are again seen.  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 No evidence  of acute abnormality including infarct, hemorrhage, mass lesion,  mass effect, midline shift or abnormal extra-axial fluid collection  is present. There is no pneumocephalus or hydrocephalus. Imaged  paranasal sinuses and mastoid air cells are clear. The calvarium is  intact.     IMPRESSION:  No acute finding.  Stable compared to prior exams.      Electronically Signed    By: Drusilla Kanner M.D.    On: 09/29/2013 11:47         Verified By: Charyl Dancer, M.D.,   Assessment/Plan:  Invasive Device Daily Assessment of Necessity:  Does the patient currently have any of the following indwelling devices? foley  central line catheter   Indwelling Urinary Catheter continued, requirement due to   Reason to continue Indwelling Urinary Catheter for strict  Intake/Output monitoring for hemodynamic instability   Central Line continued, requirement due to   Reason to continue Central Line For infusion of hypertonic or irritating solutions or drugs   Assessment/Plan:  Assessment IMP Weakess Lethgargy Bradycardia AFIB Hypotension Overdose Chronic Pain Paraphegia CRI Chronic Pain .   Plan PLAN Transfer to Tele Increase activity Wean pressors Continue ICU care Fluid hydration IV Low dose pressors as necessary for Bp Continue pain control F/U renal function for CRI DVT prophylaxis GERD meds I do not rec pacemaker Encourage po intake   Electronic Signatures: Dorothyann Peng D (MD)  (Signed 14-Jul-15 15:15)  Authored: Chief Complaint, VITAL SIGNS/ANCILLARY NOTES, Brief Assessment, Radiology Results, Assessment/Plan   Last Updated: 14-Jul-15 15:15 by Alwyn Pea (MD)

## 2014-07-16 NOTE — Consult Note (Signed)
Chief Complaint:  Subjective/Chief Complaint Denies sob More awake today. Denies cp No sob. HR still low but bp better. Improved appetite.   VITAL SIGNS/ANCILLARY NOTES: **Vital Signs.:   10-Jul-15 12:00  Pulse source if not from Vital Sign Device per cardiac monitor    13:00  Vital Signs Type Routine  Pulse Pulse 35  Pulse source if not from Vital Sign Device per cardiac monitor  Respirations Respirations 22  Systolic BP Systolic BP 114  Diastolic BP (mmHg) Diastolic BP (mmHg) 63  Mean BP 80  BP Source  if not from Vital Sign Device non-invasive  Pulse Ox % Pulse Ox % 100  Pulse Ox Activity Level  At rest  Oxygen Delivery Room Air/ 21 %  Pulse Ox Heart Rate 39  *Intake and Output.:   10-Jul-15 13:00  Grand Totals Intake:  50 Output:      Net:  50 24 Hr.:  -634.2  Dopamine      In:  0  IV (Primary)      In:  50   Brief Assessment:  GEN well developed, well nourished, no acute distress, obese   Cardiac Irregular  -- LE edema   Respiratory normal resp effort  rhonchi   Gastrointestinal Normal   Gastrointestinal details normal Soft   EXTR positive edema   Assessment/Plan:  Invasive Device Daily Assessment of Necessity:  Does the patient currently have any of the following indwelling devices? foley  central line catheter   Indwelling Urinary Catheter continued, requirement due to   Reason to continue Indwelling Urinary Catheter for strict Intake/Output monitoring for hemodynamic instability   Central Line continued, requirement due to   Reason to continue [employer] Energy[employer] Line For infusion of hypertonic or irritating solutions or drugs   Assessment/Plan:  Assessment IMP Lethgargy Bradycardia AFIB Hypotension Overdose Chronic Pain Paraphegia CRI Chronic Pain .   Plan PLAN OOB to Chair Wean pressors Continue ICU care Fluid hydration IV Low dose pressors as necessary for Bp Continue pain control F/U renal function for CRI DVT prophylaxis GERD  meds Encourage po intake   Electronic Signatures: Dorothyann Pengallwood, Melek Pownall D (MD)  (Signed 14-Jul-15 14:31)  Authored: Chief Complaint, VITAL SIGNS/ANCILLARY NOTES, Brief Assessment, Assessment/Plan   Last Updated: 14-Jul-15 14:31 by Alwyn Peaallwood, Vasiliki Smaldone D (MD)

## 2014-07-16 NOTE — Consult Note (Signed)
Chief Complaint:  Subjective/Chief Complaint Not  as  weak today but he has had trouble with his blood sugar.Marland Kitchen   VITAL SIGNS/ANCILLARY NOTES: **Vital Signs.:   12-Jul-15 12:23  Vital Signs Type Routine  Temperature Temperature (F) 98  Celsius 36.6  Pulse Pulse 51  Respirations Respirations 18  Systolic BP Systolic BP 276  Diastolic BP (mmHg) Diastolic BP (mmHg) 78  Mean BP 103  Pulse Ox % Pulse Ox % 95  Pulse Ox Activity Level  At rest  Oxygen Delivery Room Air/ 21 %  *Intake and Output.:   Daily 12-Jul-15 07:00  Grand Totals Intake:  2340 Output:  4550    Net:  -2210 24 Hr.:  -2210  Oral Intake      In:  1440  IV (Primary)      In:  850  IV (Secondary)      In:  50  Urine ml     Out:  4550  Length of Stay Totals Intake:  8442.3 Output:  13250    Net:  -4807.7   Brief Assessment:  (Data referenced from "Consultant's Daily Progress Note" 11-Jul-15 15:06)  GEN well developed, well nourished, no acute distress, obese   Cardiac Irregular  -- LE edema   Respiratory normal resp effort  rhonchi   Gastrointestinal Normal   Gastrointestinal details normal Soft   EXTR positive edema   Lab Results: Routine Chem:  12-Jul-15 07:10   Glucose, Serum 83  Result Comment LABS - This specimen was collected through an   - indwelling catheter or arterial line.  - A minimum of 30ms of blood was wasted prior    - to collecting the sample.  Interpret  - results with caution.  Result(s) reported on 03 Oct 2013 at 07:28AM.  BUN  22  Creatinine (comp) 1.13  Sodium, Serum 136  Potassium, Serum 5.1  Chloride, Serum 104  CO2, Serum 27  Calcium (Total), Serum 8.7  Anion Gap  5  Osmolality (calc) 274  eGFR (African American) >60  eGFR (Non-African American) >60 (eGFR values <659mmin/1.73 m2 may be an indication of chronic kidney disease (CKD). Calculated eGFR is useful in patients with stable renal function. The eGFR calculation will not be reliable in acutely ill  patients when serum creatinine is changing rapidly. It is not useful in  patients on dialysis. The eGFR calculation may not be applicable to patients at the low and high extremes of body sizes, pregnant women, and vegetarians.)  Routine Coag:  12-Jul-15 07:10   Prothrombin  27.4  INR 2.6 (INR reference interval applies to patients on anticoagulant therapy. A single INR therapeutic range for coumarins is not optimal for all indications; however, the suggested range for most indications is 2.0 - 3.0. Exceptions to the INR Reference Range may include: Prosthetic heart valves, acute myocardial infarction, prevention of myocardial infarction, and combinations of aspirin and anticoagulant. The need for a higher or lower target INR must be assessed individually. Reference: The Pharmacology and Management of the Vitamin K  antagonists: the seventh ACCP Conference on Antithrombotic and Thrombolytic Therapy. [id number]:126 (3suppl): [id number] HCT value >55% may artifactually increase the PT.  In one study,  the increase was an average of 25%. Reference:  "Effect on Routine and Special Coagulation Testing Values of Citrate Anticoagulant Adjustment in Patients with High HCT Values." American Journal of Clinical Pathology 2006;126:400-405.)  Routine Hem:  12-Jul-15 07:10   WBC (CBC) 4.8  RBC (CBC)  3.95  Hemoglobin (CBC) 13.6  Hematocrit (CBC) 41.3  Platelet Count (CBC)  135  MCV  105  MCH  34.4  MCHC 32.9  RDW 13.2  Neutrophil % 40.0  Lymphocyte % 18.3  Monocyte % 11.2  Eosinophil % 29.3  Basophil % 1.2  Neutrophil # 1.9  Lymphocyte #  0.9  Monocyte # 0.5  Eosinophil #  1.4  Basophil # 0.1   Radiology Results: XRay:    09-Jul-15 09:01, Chest Portable Single View  Chest Portable Single View   REASON FOR EXAM:    hypotension. SOB  COMMENTS:       PROCEDURE: DXR - DXR PORTABLE CHEST SINGLE VIEW  - Sep 30 2013  9:01AM     CLINICAL DATA:  Hypotension and shortness of  breath    EXAM:  PORTABLE CHEST - 1 VIEW    COMPARISON:  October 18, 2011    FINDINGS:  There is underlying emphysematous change. There is atelectatic  change in the right mid lung. There is no frank edema or  consolidation. There is cardiomegaly. The pulmonary vascularity is  within normal limits. No adenopathy. There is atherosclerotic change  in aorta.     IMPRESSION:  Stable cardiomegaly. Underlying emphysematous change. No appreciable  edema or consolidation. There is mild atelectatic change in the  right mid lung.      Electronically Signed    By: Lowella Grip M.D.    On: 09/30/2013 09:03       Verified By: Leafy Kindle. Jasmine December, M.D.,    09-Jul-15 14:22, Chest Portable Single View  Chest Portable Single View   REASON FOR EXAM:    CENTRAL LINE PLACEMENT  COMMENTS:       PROCEDURE: DXR - DXR PORTABLE CHEST SINGLE VIEW  - Sep 30 2013  2:22PM     CLINICAL DATA:  Central line placement    EXAM:  PORTABLE CHEST - 1 VIEW    COMPARISON:  09/30/2013    FINDINGS:  Right jugular central venous catheter in the mid SVC. Negative for  pneumothorax.  Cardiac enlargement without heart failure or pneumonia.     IMPRESSION:  Satisfactory central line placement.      Electronically Signed    By: Franchot Gallo M.D.    On: 09/30/2013 14:30         Verified By: Truett Perna, M.D.,  Cardiology:    07-Jul-15 15:22, ED ECG  Ventricular Rate 40  Atrial Rate 36  QRS Duration 118  QT 514  QTc 418  R Axis -31  T Axis 133  ECG interpretation   Undetermined rhythm  Left axis deviation  Inferior infarct (cited on or before 06-Sep-2004)  Anterior infarct (cited on or before 04-Jul-2009)  *** ** ** ** * ACUTE MI  ** ** ** **  Consider right ventricular involvement in acute inferior infarct  Abnormal ECG  When compared with ECG of 04-Dec-2011 11:49,  Current undetermined rhythm precludes rhythm comparison, needs review  QRS duration has increased  Serial changes of  Inferior infarct Present  ----------unconfirmed----------  Confirmed by OVERREAD, NOT ([id number]), editor PEARSON, BARBARA ([id number]) on 09/30/2013 2:53:55 PM  ED ECG     08-Jul-15 18:18, Echo Doppler  Echo Doppler   REASON FOR EXAM:      COMMENTS:       PROCEDURE: [place] - ECHO DOPPLER COMPLETE(TRANSTHOR)  - Sep 29 2013  6:18PM     RESULT: Echocardiogram Report    Patient Name:   Carl Molina Date  of Exam: 09/29/2013  Medical Rec #:  [id number]         Custom1:  Date of Birth:  [birth year]      Height:       69.0 in  Patient Age:    19 years       Weight:       231.0 lb  Patient Gender: M              BSA:          2.20 m??    Indications: Atrial Fib  Sonographer:    Janalee Dane RCS  Referring Phys: Hillary Bow, R    Summary:   1. Left ventricular ejection fraction, by visual estimation, is 35 to   40%.   2. Moderately decreased global left ventricular systolic function.   3. Mild to moderately increased left ventricular internal cavity size.   4. Severely enlarged right ventricle.   5. Moderately dilated left atrium.   6. Mildly dilated right atrium.   7. Mild mitral valve regurgitation.   8. Mildly elevated pulmonary artery systolic pressure.   9. Mild to moderate tricuspid regurgitation.  2D AND M-MODE MEASUREMENTS (normal ranges within parentheses):  Left Ventricle:          Normal  IVSd (2D):      1.12 cm (0.7-1.1)  LVPWd (2D):     0.99 cm (0.7-1.1) Aorta/LA:                  Normal  LVIDd (2D):     5.55 cm (3.4-5.7) Aortic Root (2D): 3.60 cm (2.4-3.7)  LVIDs (2D):     4.67 cm           Left Atrium (2D): 5.20 cm (1.9-4.0)  LV FS (2D):     15.9 %   (>25%)  LV EF (2D):     33.0 %   (>50%)                                    Right Ventricle:                                    RVd (2D):        6.10 cm  SPECTRAL DOPPLER ANALYSIS (where applicable):  Tricuspid Valve and PA/RV Systolic Pressure: TR Max Velocity: 2.26 m/s RA   Pressure: 20 mmHg RVSP/PASP: 40.5 mmHg    PHYSICIAN  INTERPRETATION:  Left Ventricle: The left ventricular internal cavity size was mild to   moderately increased. LV septal wall thickness was normal. LV posterior     wall thickness was normal. Global LV systolic function was moderately   decreased. Left ventricular ejection fraction, by visual estimation, is   35 to 40%.  Right Ventricle: The right ventricular size is severely enlarged.  Left Atrium: The left atrium is moderately dilated.  Right Atrium: The right atrium is mildly dilated.  Pericardium: There is no evidence of pericardial effusion.  Mitral Valve: The mitral valve is normal in structure. Mild mitral valve   regurgitation is seen.  Tricuspid Valve: The tricuspid valve is normal. Mild to moderate   tricuspid regurgitation is visualized. The tricuspid regurgitant velocity   is 2.26 m/s, and with an assumed right atrial pressure of 20 mmHg, the   estimated right ventricular systolic pressure is mildly elevated at 40.5  mmHg.  Aortic Valve: The aortic valve is normal. The aortic valve is     structurally normal, with no evidence of sclerosis or stenosis. No   evidence of aortic valve regurgitation is seen.  Pulmonic Valve: The pulmonic valve is normal.    [place] MD  Electronically signed by [place] MD  Signature Date/Time: 09/30/2013/2:13:13 PM    *** Final ***    IMPRESSION: .        Verified By: Yolonda Kida, M.D., MD  CT:    08-Jul-15 11:27, CT Head Without Contrast  CT Head Without Contrast   REASON FOR EXAM:    encephalopathy  COMMENTS:       PROCEDURE: CT  - CT HEAD WITHOUT CONTRAST  - Sep 29 2013 11:27AM     CLINICAL DATA:  Encephalopathy.    EXAM:  CT HEAD WITHOUT CONTRAST    TECHNIQUE:  Contiguous axial images were obtained from the base of the skull  through the vertex without intravenous contrast.    COMPARISON:  Brain MRI 10/18/2011.  Head CT scan 05/06/2011.  FINDINGS:  Atrophy and chronic microvascular  ischemic change are again seen.  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 No evidence  of acute abnormality including infarct, hemorrhage, mass lesion,  mass effect, midline shift or abnormal extra-axial fluid collection  is present. There is no pneumocephalus or hydrocephalus. Imaged  paranasal sinuses and mastoid air cells are clear. The calvarium is  intact.     IMPRESSION:  No acute finding.  Stable compared to prior exams.      Electronically Signed    By: Inge Rise M.D.    On: 09/29/2013 11:47         Verified By: Ramond Dial, M.D.,   Assessment/Plan:  Invasive Device Daily Assessment of Necessity:  Does the patient currently have any of the following indwelling devices? foley  central line catheter   Indwelling Urinary Catheter continued, requirement due to   Reason to continue Indwelling Urinary Catheter for strict Intake/Output monitoring for hemodynamic instability   Central Line continued, requirement due to   Assessment/Plan:  Assessment IMP UTI sepsis Weakess Lethgargy Bradycardia AFIB Hypotension Overdose Chronic Pain Paraphegia CRI Chronic Pain .   Plan PLAN Continue IV antibx Follow blood sugar closely Transfer to Tele Increase activity Wean pressors Continue ICU care Fluid hydration IV Low dose pressors as necessary for Bp Continue pain control F/U renal function for CRI DVT prophylaxis GERD meds I do not rec pacemaker Encourage po intake   Electronic Signatures: Lujean Amel D (MD)  (Signed 19-Jul-15 21:22)  Authored: Chief Complaint, VITAL SIGNS/ANCILLARY NOTES, Brief Assessment, Lab Results, Radiology Results, Assessment/Plan   Last Updated: 19-Jul-15 21:22 by Lujean Amel D (MD)

## 2014-07-16 NOTE — H&P (Signed)
PATIENT NAME:  Carl Molina, Carl Molina MR#:  [id number] DATE OF BIRTH:  [birth year]  DATE OF ADMISSION:  09/28/2013  PRIMARY CARE PROVIDER: Kandyce RudMarcus Babaoff, MD  CHIEF COMPLAINT:  Overdosed on metoprolol with bradycardia.   HISTORY OF PRESENT ILLNESS: An 81 year old obese African American male patient with history of atrial fibrillation, coronary artery disease, paraplegia, baseline bradycardia and chronic back pain, presents to the Emergency Room after he took 10 pills of metoprolol. The patient ran out of his methocarbamol and called his primary care physician's office. The call was taken by a nurse. During the phone conversation, he was asked if he ran out of metoprolol, to which he answered yes and a refill was sent. After receiving the medications, the patient realized that the medication was 50 mg in dose and took 10 pills to make it 500 mg as the methocarbamol that he takes.  Then he called his doctor's office, who sent him here. Here, the patient has been found to have bradycardia into as low as 20. His blood pressure seems to be stable at this point but the patient is being admitted to the Intensive Care Unit at this point. He complains of some lightheadedness.   PAST MEDICAL HISTORY: 1.  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  2.  Coronary artery disease with myocardial infarction perioperatively in 2003.  3.  Hyperlipidemia.  4.  Hypertension.  5.  Atrial fibrillation.  6.  Chronic low back pain.  7.  Benign prostatic hypertrophy, history of prostatitis.  8.  Suprapubic catheter.  9.  Colonic polyps.  10.  Depression.  11.  Degenerative disease of bilateral knees.   PAST SURGICAL HISTORY: 1.  Tonsillectomy.  2.  Back surgeries for disk disease.  3.  Suprapubic catheter placement for neurogenic bladder.   ALLERGIES: No known drug allergies.   SOCIAL HISTORY: The patient lives at home, uses a wheelchair to move around. Does not  smoke. No alcohol. No illicit drugs.   CODE STATUS: Full code.   FAMILY HISTORY: The patient's mother and father did not have any medical problems.   REVIEW OF SYSTEMS: CONSTITUTIONAL: Complains of some fatigue.  EYES: No blurred vision, pain or redness. HEENT:  No tinnitus, ear pain, hearing loss.  RESPIRATORY: No cough, wheeze, hemoptysis.    CARDIOVASCULAR: No chest pain, orthopnea, edema.   GASTROINTESTINAL:  No nausea, vomiting, diarrhea, abdominal pain.   GENITOURINARY: Has suprapubic catheter.  ENDOCRINE: No polyuria, nocturia, thyroid problems.  HEMATOLOGIC AND LYMPHATIC: No anemia, easy bruising, bleeding.  INTEGUMENTARY: No acne, rash, lesion.  MUSCULOSKELETAL: Has chronic pain in his knees and back.  NEUROLOGIC: Has paraplegia.  PSYCHIATRIC: No anxiety or depression.   HOME MEDICATIONS: 1.  Acetaminophen 325 two tablets oral 3 times a day as needed.  2.  Baclofen 10 mg 1/2 tablet in the morning and 1 tablet in the evening.  3.  Lasix 20 mg daily.  4.  Hydrochlorothiazide/lisinopril 12/20 one tablet once a day.  5.  Hydroxyzine 1 tablet oral 2 times a day as needed, 10 mg.  6.  Lyrica 100 mg 3 times a day.  7.  Methocarbamol 750 mg 2 tablets oral 3 times a day.  8.  Metoprolol 50 mg oral 3 times a day.  9.  Nitrofurantoin 2 capsules oral once a day.  10.  Nortriptyline 10 mg 2 capsules oral once a day.  11.  Pravastatin 40 mg daily.  12.  Warfarin 3 mg oral  once a day.   PHYSICAL EXAMINATION: VITAL SIGNS: Temperature of 98.3, pulse of 28 to 35, blood pressure 135/65, saturating 92% on room air.  GENERAL: Morbidly obese African American male patient lying in bed, seems comfortable, conversational, cooperative with exam.  PSYCHIATRIC: Alert and nourished x 3. Mood and affect appropriate. Judgment intact.  HEENT: Atraumatic, normocephalic. Oral mucosa moist and pink. External ears and nose normal. No pallor. No icterus. Pupils bilaterally equal and reactive to light.   NECK: Supple. No thyromegaly or palpable lymph nodes. Trachea midline. No carotid bruit or JVD.  CARDIOVASCULAR: S1, S2, bradycardic. Has a systolic murmur. Peripheral pulses 2+. Has no edema.  RESPIRATORY: Normal work of breathing. Clear to auscultation on both sides.  GASTROINTESTINAL: Soft abdomen, nontender. Bowel sounds present.  GENITOURINARY: Has no suprapubic tenderness, has a suprapubic catheter with no discharge.  MUSCULOSKELETAL: No joint swelling, redness of large joints. Normal muscle tone, flaccid in lower extremities.  SKIN: Warm and dry. No petechiae, rash, ulcers.  NEUROLOGICAL:  Paraplegia of lower extremities.  LABORATORY, DIAGNOSTIC AND RADIOLOGICAL DATA:  Show:  1.  Glucose of 103, BUN 43, creatinine 2.11 with sodium 120, potassium 4.8, chloride 89, bicarbonate 23 with GFR 28.  2.  WBC 5, hemoglobin 13.7, platelets of 139.  3.  EKG shows atrial fibrillation with bradycardia at 40. No acute ST elevation or depression.   ASSESSMENT AND PLAN: 1.  Overdose on metoprolol secondary to medication mix-up. The patient took 10 pills of metoprolol. Presently bradycardic. Will need glucagon drip, admit to Critical Care Unit. The patient is critically ill and at high risk for cardiac arrest and death. It has been explained to the patient and family at bedside. We will check TSH secondary to his baseline bradycardia.  2.  Acute renal failure with chronic kidney disease.  The patient's last known creatinine was normal 2 years prior, presently is at 2.11. He is on Lasix, lisinopril/hydrochlorothiazide which will be held at this point. Start him on IV fluids. Monitor I's and O's.  3.  Hyponatremia. The patient does not have any fluids, likely has hypovolemic hyponatremia. At this point will get urine studies.  4.  Atrial fibrillation. The patient is on Coumadin. We will check an INR level and continue warfarin.  5.  Hypertension. Will hold the hydrochlorothiazide/lisinopril secondary to  acute renal failure and hyponatremia. Will use IV p.r.n. medications as needed.  6.  Deep venous thrombosis prophylaxis. The patient is on Coumadin.  7.  CODE STATUS: Full code.   TIME SPENT TODAY ON THIS CASE: 45 minutes of critical care time.   ____________________________ Molinda Bailiff Aizza Santiago, MD srs:cs D: 09/28/2013 18:47:00 ET T: 09/28/2013 19:13:03 ET JOB#: [id number]  cc: Wardell Heath R. Seini Lannom, MD, <Dictator> Kandyce Rud, MD Marcina Millard, MD Orie Fisherman MD ELECTRONICALLY SIGNED 10/05/2013 18:17

## 2014-07-16 NOTE — Consult Note (Signed)
PATIENT NAME:  Carl Molina, [name] MR#:  [id number] DATE OF BIRTH:  May 21, 1929  DATE OF CONSULTATION:  09/29/2013  REFERRING PHYSICIAN:  Dr. Elpidio AnisSudini CONSULTING PHYSICIAN:  Dwayne D. Juliann Paresallwood, MD  PRIMARY CARE PHYSICIAN:  Dr. Larwance SachsBabaoff   INDICATION: Severe bradycardia secondary to metoprolol overdose.   HISTORY OF PRESENT ILLNESS: The patient is an 81 year old African American male with a history of atrial fibrillation, coronary artery disease, and paraplegia, presented with severe bradycardia after mistakenly taking too much metoprolol at home. He admits taking it for his muscle relaxant. He has chronic back pain and spasm. After taking 10 metoprolol 50 mg pills, he came to the Emergency Room when he realized his mistake. His heart rate was in the low 20s, but he maintained a reasonable blood pressure.  His underlying rhythm is atrial fibrillation. He was placed in intensive care after discussion with poison control. Unfortunately, he came several hours after he had taken the medication so charcoal or none of the other medications would have been helpful.  He denied any chest pain. He was lethargic, weak, and fatigued.   PAST MEDICAL HISTORY: Paraplegia secondary to spinal cord perioperative infarct during [facility] at [facility] in 2003, coronary artery disease, status post myocardial infarction in 2003, hyperlipidemia, hypertension, atrial fibrillation, chronic lower back pain, benign prostatic hypertrophy, history of prostatitis, suprapubic catheter, colonic polyps, depression, degenerative joint disease bilaterally in the knees, chronic lower back pain, spasm.   PAST SURGICAL HISTORY:  Tonsillectomy, back surgery, suprapubic catheter placement.   ALLERGIES: None.   SOCIAL HISTORY: Lives at home. He was in a wheelchair. No smoking. No alcohol consumption. He is a full code.   FAMILY HISTORY: Hypertension.   REVIEW OF SYSTEMS:   Patient denies any significant blackout spells or syncope. No nausea,  vomiting. Denies fever, chills, sweat. No weight loss or weight gain, hemoptysis, hematemesis. Denies bright red blood per rectum.  He is paraplegic.  Has some lethargy and altered mental status, probably related to medication.   MEDICATIONS AT HOME:  Tylenol 325 three times a day; baclofen 10 mg tablet 1 half in the morning, 1 in the evening; Lasix 20 mg a day; hydrochlorothiazide/lisinopril 12.5/20; hydroxyzine 1 tablet 2 times a day as needed p.r.n.; Lortab 100 mg 3 times a day; methocarbamol 750 mg 2 tablets 3 times a day as needed; metoprolol 50 mg 3 times a day; nortriptyline 10 mg 2 tablets once a day; Pravachol 40 mg a day; warfarin 3 mg once a day.   PHYSICAL EXAMINATION:  VITAL SIGNS: Blood pressure 110/60. Pulse was 40 and irregular, respiratory rate of 16, afebrile, lying in bed. Lethargic, sleepy, but arousable.  HEENT: Normocephalic, atraumatic. Pupils equal and reactive to light.  NECK: Supple. No significant JVD, bruits or adenopathy.  LUNGS: Clear to auscultation and percussion. No significant wheeze, rhonchi, or rale.  HEART: Bradycardic, irregular. Systolic ejection murmur at the apex.  ABDOMEN: Benign.  EXTREMITIES:  Flaccid legs. Adequate pulses.  NEUROLOGIC: He is paraplegic, lower extremities.  Again, the patient is lethargic but arousable.   LABORATORY DATA: Glucose was 103 initially.  BUN of 43, creatinine 2.11, sodium 120, potassium 4.0, chloride of 83, bicarbonate 23. White count of 5, hemoglobin 13.7, platelet count of 139.   IMAGING DATA:  EKG: Atrial fibrillation, rate of 40, nonspecific ST-T wave changes.   ASSESSMENT:  1. Beta blocker overdose, accidental.  2. Bradycardia secondary to the overdose.  3. Acute on chronic renal insufficiency.  4. Hyponatremia.  5. Atrial fibrillation,  which is chronic.  6. History of hypertension, but now slightly hypotensive.  7. Paraplegia.  8. Chronic lower back pain.  9. History of benign prostatic hypertrophy.   10. History of coronary artery disease and myocardial infarction.   PLAN:  1. Agree with intensive care therapy. Would hold off on temporary pacemaker at this point. Continue conservative watchful waiting until the beta blocker effect wears off. As long as he maintains adequate blood pressure, do not see a need for temporary pacemaker. I would accept a blood pressure of 90 systolic and a heart rate of greater than 30 provided he okay.  2. Continue anticoagulation for atrial fibrillation and he is on Coumadin. Maintain the INR. Hold blood pressure medicines for now since he is slightly hypotensive.  3. Correct electrolytes  is hyponatremia.  4. Maintain adequate glucose level. He had a bout of hypoglycemia.  5. Continue lipid management.  6. Continue pain control for his chronic pain syndrome.  7. Continue to treat the patient medically and conservatively and we will see how the patient responds.    ____________________________ Bobbie Stack Juliann Pares, MD ddc:dd D: 09/29/2013 17:12:20 ET T: 09/29/2013 19:43:45 ET JOB#: [id number]  cc: Dwayne D. Juliann Pares, MD, <Dictator> Alwyn Pea MD ELECTRONICALLY SIGNED 11/08/2013 10:14

## 2015-02-09 ENCOUNTER — Ambulatory Visit (HOSPITAL_COMMUNITY)
Admission: AD | Admit: 2015-02-09 | Discharge: 2015-02-09 | Disposition: A | Discharge status: Home / Self Care | Payer: Medicare Other | Source: Other Acute Inpatient Hospital | Attending: Student | Admitting: Student

## 2015-02-09 ENCOUNTER — Emergency Department: Payer: Medicare Other

## 2015-02-09 ENCOUNTER — Emergency Department
Admission: EM | Admit: 2015-02-09 | Discharge: 2015-02-10 | Disposition: A | Discharge status: Other Acute Inpatient Hospital | Payer: Medicare Other | Attending: Student | Admitting: Student

## 2015-02-09 DIAGNOSIS — A419 Sepsis, unspecified organism: Secondary | ICD-10-CM | POA: Insufficient documentation

## 2015-02-09 DIAGNOSIS — N179 Acute kidney failure, unspecified: Secondary | ICD-10-CM | POA: Diagnosis not present

## 2015-02-09 DIAGNOSIS — R531 Weakness: Secondary | ICD-10-CM | POA: Diagnosis present

## 2015-02-09 DIAGNOSIS — N390 Urinary tract infection, site not specified: Secondary | ICD-10-CM | POA: Diagnosis not present

## 2015-02-09 DIAGNOSIS — I214 Non-ST elevation (NSTEMI) myocardial infarction: Secondary | ICD-10-CM | POA: Insufficient documentation

## 2015-02-09 DIAGNOSIS — R4182 Altered mental status, unspecified: Secondary | ICD-10-CM | POA: Insufficient documentation

## 2015-02-09 DIAGNOSIS — Z79899 Other long term (current) drug therapy: Secondary | ICD-10-CM | POA: Diagnosis not present

## 2015-02-09 LAB — LACTIC ACID, PLASMA
LACTIC ACID, VENOUS: 5.1 mmol/L — AB (ref 0.5–2.0)
LACTIC ACID, VENOUS: 5.2 mmol/L — AB (ref 0.5–2.0)

## 2015-02-09 LAB — COMPREHENSIVE METABOLIC PANEL
ALK PHOS: 79 U/L (ref 38–126)
ALT: 11 U/L — AB (ref 17–63)
ANION GAP: 11 (ref 5–15)
AST: 30 U/L (ref 15–41)
Albumin: 3 g/dL — ABNORMAL LOW (ref 3.5–5.0)
BILIRUBIN TOTAL: 2 mg/dL — AB (ref 0.3–1.2)
BUN: 53 mg/dL — ABNORMAL HIGH (ref 6–20)
CALCIUM: 9.2 mg/dL (ref 8.9–10.3)
CO2: 28 mmol/L (ref 22–32)
CREATININE: 4.02 mg/dL — AB (ref 0.61–1.24)
Chloride: 102 mmol/L (ref 101–111)
GFR, EST AFRICAN AMERICAN: 15 mL/min — AB (ref >60)
GFR, EST NON AFRICAN AMERICAN: 13 mL/min — AB (ref >60)
Glucose, Bld: 95 mg/dL (ref 65–99)
Potassium: 4.5 mmol/L (ref 3.5–5.1)
SODIUM: 141 mmol/L (ref 135–145)
TOTAL PROTEIN: 5.9 g/dL — AB (ref 6.5–8.1)

## 2015-02-09 LAB — URINALYSIS COMPLETE WITH MICROSCOPIC (ARMC ONLY)
Bilirubin Urine: NEGATIVE
Glucose, UA: NEGATIVE mg/dL
KETONES UR: NEGATIVE mg/dL
NITRITE: NEGATIVE
PH: 8 (ref 5.0–8.0)
PROTEIN: 100 mg/dL — AB
SPECIFIC GRAVITY, URINE: 1.01 (ref 1.005–1.030)
SQUAMOUS EPITHELIAL / LPF: NONE SEEN

## 2015-02-09 LAB — CBC WITH DIFFERENTIAL/PLATELET
BASOS ABS: 0 10*3/uL (ref 0–0.1)
EOS ABS: 0 10*3/uL (ref 0–0.7)
Eosinophils Relative: 0 %
HEMATOCRIT: 43.6 % (ref 40.0–52.0)
HEMOGLOBIN: 14.4 g/dL (ref 13.0–18.0)
Lymphocytes Relative: 5 %
Lymphs Abs: 0.4 10*3/uL — ABNORMAL LOW (ref 1.0–3.6)
MCH: 33.9 pg (ref 26.0–34.0)
MCHC: 33.1 g/dL (ref 32.0–36.0)
MCV: 102.4 fL — ABNORMAL HIGH (ref 80.0–100.0)
Monocytes Absolute: 0.5 10*3/uL (ref 0.2–1.0)
Monocytes Relative: 8 %
NEUTROS ABS: 5.8 10*3/uL (ref 1.4–6.5)
Platelets: 97 10*3/uL — ABNORMAL LOW (ref 150–440)
RBC: 4.26 MIL/uL — ABNORMAL LOW (ref 4.40–5.90)
RDW: 14.1 % (ref 11.5–14.5)
WBC: 6.7 10*3/uL (ref 3.8–10.6)

## 2015-02-09 LAB — TROPONIN I: TROPONIN I: 0.09 ng/mL — AB (ref <0.031)

## 2015-02-09 LAB — PROTIME-INR
INR: 1.38
PROTHROMBIN TIME: 17.1 s — AB (ref 11.4–15.0)

## 2015-02-09 LAB — APTT: APTT: 29 s (ref 24–36)

## 2015-02-09 LAB — LIPASE, BLOOD: Lipase: 20 U/L (ref 11–51)

## 2015-02-09 MED ORDER — NOREPINEPHRINE 4 MG/250ML-% IV SOLN
INTRAVENOUS | Status: AC
  Administered 2015-02-09: 2 ug/min via INTRAVENOUS
  Filled 2015-02-09: qty 250

## 2015-02-09 MED ORDER — SODIUM CHLORIDE 0.9 % IV BOLUS (SEPSIS)
1000.0000 mL | Freq: Once | INTRAVENOUS | Status: AC
  Administered 2015-02-09: 1000 mL via INTRAVENOUS

## 2015-02-09 MED ORDER — NOREPINEPHRINE BITARTRATE 1 MG/ML IV SOLN: 0.0000 ug/min | Freq: Once | INTRAVENOUS | Status: DC

## 2015-02-09 MED ORDER — VANCOMYCIN HCL IN DEXTROSE 1-5 GM/200ML-% IV SOLN
1000.0000 mg | Freq: Once | INTRAVENOUS | Status: AC
  Administered 2015-02-09: 1000 mg via INTRAVENOUS
  Filled 2015-02-09: qty 200

## 2015-02-09 MED ORDER — PIPERACILLIN-TAZOBACTAM 3.375 G IVPB: 3.3750 g | Freq: Two times a day (BID) | INTRAVENOUS | Status: DC

## 2015-02-09 MED ORDER — NOREPINEPHRINE 4 MG/250ML-% IV SOLN
0.0000 ug/min | INTRAVENOUS | Status: DC
  Administered 2015-02-09: 2 ug/min via INTRAVENOUS

## 2015-02-09 MED ORDER — VANCOMYCIN HCL IN DEXTROSE 1-5 GM/200ML-% IV SOLN: 1000.0000 mg | INTRAVENOUS | Status: DC | PRN

## 2015-02-09 MED ORDER — VANCOMYCIN HCL IN DEXTROSE 750-5 MG/150ML-% IV SOLN
750.0000 mg | Freq: Once | INTRAVENOUS | Status: AC
Start: 2015-02-09 — End: 2015-02-09
  Administered 2015-02-09: 750 mg via INTRAVENOUS
  Filled 2015-02-09: qty 150

## 2015-02-09 MED ORDER — PIPERACILLIN-TAZOBACTAM 3.375 G IVPB 30 MIN
3.3750 g | Freq: Once | INTRAVENOUS | Status: AC
  Administered 2015-02-09: 3.375 g via INTRAVENOUS
  Filled 2015-02-09: qty 50

## 2015-02-09 MED ORDER — ACETAMINOPHEN 650 MG RE SUPP
650.0000 mg | Freq: Once | RECTAL | Status: AC
  Administered 2015-02-09: 650 mg via RECTAL
  Filled 2015-02-09: qty 1

## 2015-02-09 NOTE — ED Provider Notes (Addendum)
[facility] Emergency Department Provider Note  ____________________________________________  Time seen: Approximately 3:34 PM  I have reviewed the triage vital signs and the nursing notes.   HISTORY  Chief Complaint Weakness and Altered Mental Status  Caveat-history of present illness review of systems limited secondary to the patient's altered mental status. Information obtained from EMS as well as wife on her arrival.  HPI Carl Molina is a 81 y.o. male with history of CVA with residual left-sided deficits, CHF, neurogenic bladder requiring indwelling suprapubic Foley, atrial fibrillation currently off of Coumadin due to recent intracranial hemorrhage presents for evaluation from [facility] due to decreased level consciousness/altered mental status which began yesterday. Wife reports that he has had foul-smelling urine and decreased level of consciousness gradual onset yesterday and she reports that this is similar to his prior presentations for urinary tract infection. No fevers, vomiting, diarrhea.   No past medical history on file.  There are no active problems to display for this patient.   No past surgical history on file.  Current Outpatient Rx  Name  Route  Sig  Dispense  Refill  . acetaminophen (TYLENOL) 325 MG tablet   Oral   Take 650 mg by mouth every 6 (six) hours as needed.         Marland Kitchen amLODipine (NORVASC) 5 MG tablet   Oral   Take 5 mg by mouth daily.         . bisacodyl (DULCOLAX) 10 MG suppository   Rectal   Place 10 mg rectally as needed for moderate constipation.         . Cholecalciferol 10000 UNITS CAPS   Oral   Take 2 capsules by mouth daily.         Marland Kitchen CRANBERRY JUICE EXTRACT PO   Oral   Take 120 mLs by mouth 2 (two) times daily. of cranberry juice in of water.         . DULoxetine (CYMBALTA) 30 MG capsule   Oral   Take 30 mg by mouth daily.         . ferrous sulfate 325 (65 FE) MG tablet    Oral   Take 325 mg by mouth daily with breakfast.         . furosemide (LASIX) 40 MG tablet   Oral   Take 40 mg by mouth 2 (two) times daily.         Marland Kitchen lisinopril (PRINIVIL,ZESTRIL) 20 MG tablet   Oral   Take 20 mg by mouth daily.         . magnesium oxide (MAG-OX) 400 MG tablet   Oral   Take 800 mg by mouth 2 (two) times daily.         . polyvinyl alcohol (LIQUIFILM TEARS) 1.4 % ophthalmic solution   Both Eyes   Place 2 drops into both eyes as needed for dry eyes.         . pravastatin (PRAVACHOL) 20 MG tablet   Oral   Take 20 mg by mouth at bedtime.         . pregabalin (LYRICA) 100 MG capsule   Oral   Take 100 mg by mouth 2 (two) times daily.         . pregabalin (LYRICA) 150 MG capsule   Oral   Take 150 mg by mouth 2 (two) times daily.         . sennosides-docusate sodium (SENOKOT-S) 8.6-50 MG tablet   Oral  Take 1 tablet by mouth 2 (two) times daily.           Allergies Ciprofloxacin  No family history on file.  Social History Social History  Substance Use Topics  . Smoking status: Never Smoker   . Smokeless tobacco: Not on file  . Alcohol Use: No     Comment: never    Review of Systems  Constitutional: No fever/chills Gastrointestinal: No vomiting.  No diarrhea.     Caveat-history of present illness review of systems limited secondary to the patient's altered mental status. Information obtained from EMS as well as wife on her arrival. ____________________________________________   PHYSICAL EXAM:  Filed Vitals:   02/09/15 2130 02/09/15 2215 02/09/15 2245 02/09/15 2320  BP: 94/55 105/60 108/92 91/51  Pulse:  76  63  Temp:      TempSrc:      Resp:  15 21 21   Height:      Weight:      SpO2:  100%  100%    Constitutional: The patient is lethargic but awakens to voice and light touch, follows commands briefly for neuro exam and then falls asleep. He is speaking incoherently. Eyes: Conjunctivae are normal. PERRL.  EOMI. Head: Atraumatic. Nose: No congestion/rhinnorhea. Mouth/Throat: Mucous membranes are dry.  Oropharynx non-erythematous. Neck: No stridor.  Cardiovascular: Normal rate, regular rhythm. Grossly normal heart sounds.  Good peripheral circulation. Respiratory: Normal respiratory effort.  No retractions. Lungs CTAB. Gastrointestinal: Soft and nontender. No distention. Suprapubic indwelling Foley catheter in the lower abdomen to the right of center is draining cloudy yellow urine. Genitourinary: normal external genitalia Musculoskeletal: No lower extremity tenderness nor edema.  No joint effusions. Neurologic:  Appears confused with incomprehensible speech. Awakens briefly and follows commands to move his extremities. He has good strength in the right upper and right lower extremity but has diminished strength in the left upper and lower extremity which his wife reports is chronic since his stroke. Skin:  Several stage 2 sacral decubitus ulcers. Psychiatric: Mood and affect are normal. Speech and behavior are normal.  ____________________________________________   LABS (all labs ordered are listed, but only abnormal results are displayed)  Labs Reviewed  COMPREHENSIVE METABOLIC PANEL - Abnormal; Notable for the following:    BUN 53 (*)    Creatinine, Ser 4.02 (*)    Total Protein 5.9 (*)    Albumin 3.0 (*)    ALT 11 (*)    Total Bilirubin 2.0 (*)    GFR calc non Af Amer 13 (*)    GFR calc Af Amer 15 (*)    All other components within normal limits  CBC WITH DIFFERENTIAL/PLATELET - Abnormal; Notable for the following:    RBC 4.26 (*)    MCV 102.4 (*)    Platelets 97 (*)    Lymphs Abs 0.4 (*)    All other components within normal limits  LACTIC ACID, PLASMA - Abnormal; Notable for the following:    Lactic Acid, Venous 5.1 (*)    All other components within normal limits  LACTIC ACID, PLASMA - Abnormal; Notable for the following:    Lactic Acid, Venous 5.2 (*)    All other  components within normal limits  TROPONIN I - Abnormal; Notable for the following:    Troponin I 0.09 (*)    All other components within normal limits  PROTIME-INR - Abnormal; Notable for the following:    Prothrombin Time 17.1 (*)    All other components within normal limits  URINALYSIS  COMPLETEWITH MICROSCOPIC ([facility] ONLY) - Abnormal; Notable for the following:    Color, Urine YELLOW (*)    APPearance TURBID (*)    Hgb urine dipstick 3+ (*)    Protein, ur 100 (*)    Leukocytes, UA 2+ (*)    Bacteria, UA MANY (*)    All other components within normal limits  CULTURE, BLOOD (ROUTINE X 2)  CULTURE, BLOOD (ROUTINE X 2)  URINE CULTURE  LIPASE, BLOOD  APTT   ____________________________________________  EKG  ED ECG REPORT I, Gayla Doss, the attending physician, personally viewed and interpreted this ECG.   Date: 02/09/2015  EKG Time: 15:31  Rate: 86  Rhythm: atrial fibrillation, rate 86  Axis: normal  Intervals:nonspecific intraventricular conduction delay  ST&T Change: No acute ST elevation. Bigeminy.  ____________________________________________  RADIOLOGY  CXR IMPRESSION: Cardiac enlargement with vascular congestion. Negative for edema  Left lower lobe atelectasis and effusion.  CXR IMPRESSION: Right IJ venous catheter terminates at the cavoatrial junction.  CT head IMPRESSION: 1. Left subacute intraparenchymal hemorrhage which may originate from the hippocampus, subependymal, or choroidal. Findings were reviewed with Dr. Mosetta Putt, who concurs. 2. No midline shift, Herniation, hydrocephalus, or other complicating features.  Critical Value/emergent results were discussed by telephone at the time of interpretation on 02/09/2015 at 8:25 pm with Dr. Toney Rakes , who verbally acknowledged these results.  3. Old right parietal infarct, more extensive than was seen on most recent previous study of  01/03/2014.  ____________________________________________   PROCEDURES  Procedure(s) performed:   CENTRAL LINE Performed by: Toney Rakes A Consent: The procedure was performed in an emergent situation. Required items: required blood products, implants, devices, and special equipment available Patient identity confirmed: arm band and provided demographic data Time out: Immediately prior to procedure a "time out" was called to verify the correct patient, procedure, equipment, support staff and site/side marked as required. Indications: vascular access Anesthesia: local infiltration Local anesthetic: lidocaine 1% with epinephrine Anesthetic total: 3 ml Patient sedated: no Preparation: skin prepped with 2% chlorhexidine Skin prep agent dried: skin prep agent completely dried prior to procedure Sterile barriers: all five maximum sterile barriers used - cap, mask, sterile gown, sterile gloves, and large sterile sheet Hand hygiene: hand hygiene performed prior to central venous catheter insertion  Location details: right IJ  Catheter type: triple lumen Catheter size: 8 Fr Pre-procedure: landmarks identified Ultrasound guidance: yes Successful placement: yes Post-procedure: line sutured and dressing applied Assessment: blood return through all parts, free fluid flow, placement verified by x-ray and no pneumothorax on x-ray Patient tolerance: Patient tolerated the procedure well with no immediate complications.  .  Critical Care performed: Yes, see critical care note(s). Total critical care time spent 45 minutes  ____________________________________________   INITIAL IMPRESSION / ASSESSMENT AND PLAN / ED COURSE  Pertinent labs & imaging results that were available during my care of the patient were reviewed by me and considered in my medical decision making (see chart for details).  Carl Molina is a 81 y.o. male with history of CVA with residual left-sided deficits, CHF,  neurogenic bladder requiring indwelling suprapubic Foley, atrial fibrillation currently off of Coumadin due to recent intracranial hemorrhage presents for [facility] due to decreased level consciousness/altered mental status which began yesterday. On arrival to the emergency department he appears lethargic. He is febrile with a temperature 101.3, tachypnea and hypotensive. He is meeting several Sirs criteria. We'll treat with liberal IV fluids, vancomycin and Zosyn. We'll obtain urinalysis, chest  x-ray blood cultures and anticipate admission. Patient is a DO NOT RESUSCITATE on arrival and presents with DNR goldenrod sheet. DNR/DNI code status confirmed with the patient's wife on arrival.   ----------------------------------------- 6:17 PM on 02/09/2015 -----------------------------------------  Creatinine elevated at 4.02 concerning for acute renal failure. Bladder scan reveals 619 mL in the bladder. I suspect his suprapubic Foley catheter is not functioning appropriately Therefore Will place, Foley catheter. Urinalysis concerning for urinary tract infection. Chest x-ray shows no evidence of pneumonia. Venous lactic acid is elevated at 5.1 and troponin elevated 0.09. At this point, despite attempted aggressive fluid resuscitation, the patient remains hypotensive with blood pressure of 80/50  and his wife has been consented for central line placement. We'll proceed with central line and begin levophed for blood pressure support.Once he is stable for transport to CT, will obtain CT head.   ----------------------------------------- 8:12 PM on 02/09/2015 -----------------------------------------  Discussed CT scan head with radiologist, intracranial hemorrhage appears subacute and patient did sustain intracranial hemorrhage 3 weeks ago, no evidence of new bleed. Central line placement confirmed with chest x-ray, said started. Systolic blood pressure is now 95. Currently there are no ICU  bed available here at [facility] and the patient's family requests transfer to the [place] where he primarily seeks [facility]. [facility].  ----------------------------------------- 11:04 PM on 02/09/2015 -----------------------------------------  Received a call back from Dr. Lucianne Muss, MICU resident at the [facility] who will accept the patient as a transfer to the MICU under the care of attending physician Dr. Clydell Hakim. Maintaining Maps greater than 65 on Levophed at this time. No improvement in mentation. ____________________________________________   FINAL CLINICAL IMPRESSION(S) / ED DIAGNOSES  Final diagnoses:  Sepsis, due to unspecified organism [facility])  UTI (lower urinary tract infection)  Acute renal failure, unspecified acute renal failure type [facility])  NSTEMI (non-ST elevated myocardial infarction) (HCC)      Gayla Doss, MD 02/09/15 2330  Gayla Doss, MD 02/09/15 [id number]

## 2015-02-09 NOTE — ED Notes (Signed)
[facility]  Pt with increased weakness and AMS today  - noticed and [facility] attention by his spouse  [facility]   Suprapubic cath in place  EMS reports a suspected UTI

## 2015-02-09 NOTE — Progress Notes (Signed)
ANTIBIOTIC CONSULT NOTE - INITIAL  Pharmacy Consult for vancomycin and piperacillin/tazobactam Indication: rule out sepsis  Allergies  Allergen Reactions  . Ciprofloxacin     Patient Measurements: Height: 5\' 9"  (175.3 cm) Weight: 215 lb (97.523 kg) IBW/kg (Calculated) : 70.7 Adjusted Body Weight: 81 kg  Vital Signs: Temp: 101.3 F (38.5 C) (11/17 1624) Temp Source: Rectal (11/17 1624) BP: 72/52 mmHg (11/17 1535) Pulse Rate: 71 (11/17 1624) Intake/Output from previous day:   Intake/Output from this shift:    Labs:  Recent Labs  02/09/15 1540  WBC 6.7  HGB 14.4  PLT 97*  CREATININE 4.02*   Estimated Creatinine Clearance: 16 mL/min (by C-G formula based on Cr of 4.02). No results for input(s): VANCOTROUGH, VANCOPEAK, VANCORANDOM, GENTTROUGH, GENTPEAK, GENTRANDOM, TOBRATROUGH, TOBRAPEAK, TOBRARND, AMIKACINPEAK, AMIKACINTROU, AMIKACIN in the last 72 hours.   Microbiology: No results found for this or any previous visit (from the past 720 hour(s)).  Medical History: No past medical history on file.  Medications:  Anti-infectives    Start     Dose/Rate Route Frequency Ordered Stop   02/10/15 1200  vancomycin (VANCOCIN) IVPB 1000 mg/200 mL premix     1,000 mg 200 mL/hr over 60 Minutes Intravenous Every Dialysis 02/09/15 2001     02/10/15 0800  piperacillin-tazobactam (ZOSYN) IVPB 3.375 g     3.375 g 12.5 mL/hr over 240 Minutes Intravenous Every 12 hours 02/09/15 2003     02/09/15 2100  vancomycin (VANCOCIN) IVPB 750 mg/150 ml premix     750 mg 150 mL/hr over 60 Minutes Intravenous  Once 02/09/15 2001     02/09/15 1545  piperacillin-tazobactam (ZOSYN) IVPB 3.375 g     3.375 g 100 mL/hr over 30 Minutes Intravenous  Once 02/09/15 1537     02/09/15 1545  vancomycin (VANCOCIN) IVPB 1000 mg/200 mL premix     1,000 mg 200 mL/hr over 60 Minutes Intravenous  Once 02/09/15 1537 02/09/15 1813     Assessment: Pharmacy consulted to dose vancomycin and  piperacillin/tazobactam in this 81 year old male presenting to the ED with sepsis. No notes entered on patient at this time but spoke with RN who confirmed that patient is a dialysis patient.   Adjusted body weight = 81 kg  Goal of Therapy:  Vancomycin trough level 15-20 mcg/ml  Plan:  Measure antibiotic drug levels at steady state Follow up culture results  Patient received vancomycin 1g in the ED around 1700. Will give another 750 mg dose to complete loading dose  Have ordered 1g vancomycin to be given at the end of dialysis sessions Pharmacy will follow up with HD schedule and trough should be drawn prior to 3rd session.   Thank you for the consult.  Jodelle RedMary M Shenna Brissette 02/09/2015,8:03 PM

## 2015-02-10 NOTE — ED Notes (Addendum)
[facility] arrived for pt.  Bedside report given, pt placed on [facility] monitor, medication drips given to [facility].  Levophed going at 2912mcg/min.

## 2015-02-13 LAB — URINE CULTURE: Culture: >=100000

## 2015-02-14 LAB — CULTURE, BLOOD (ROUTINE X 2)

## 2015-03-26 DEATH — deceased

## 2016-05-12 IMAGING — CR DG CHEST 1V PORT
1 series · 1 of 1 positions shown · non-contrast
Comparison: 02/09/2015 at 6444 hours

CLINICAL DATA: Central line placement, altered mental status,
weakness

EXAM:
PORTABLE CHEST 1 VIEW

[ap]
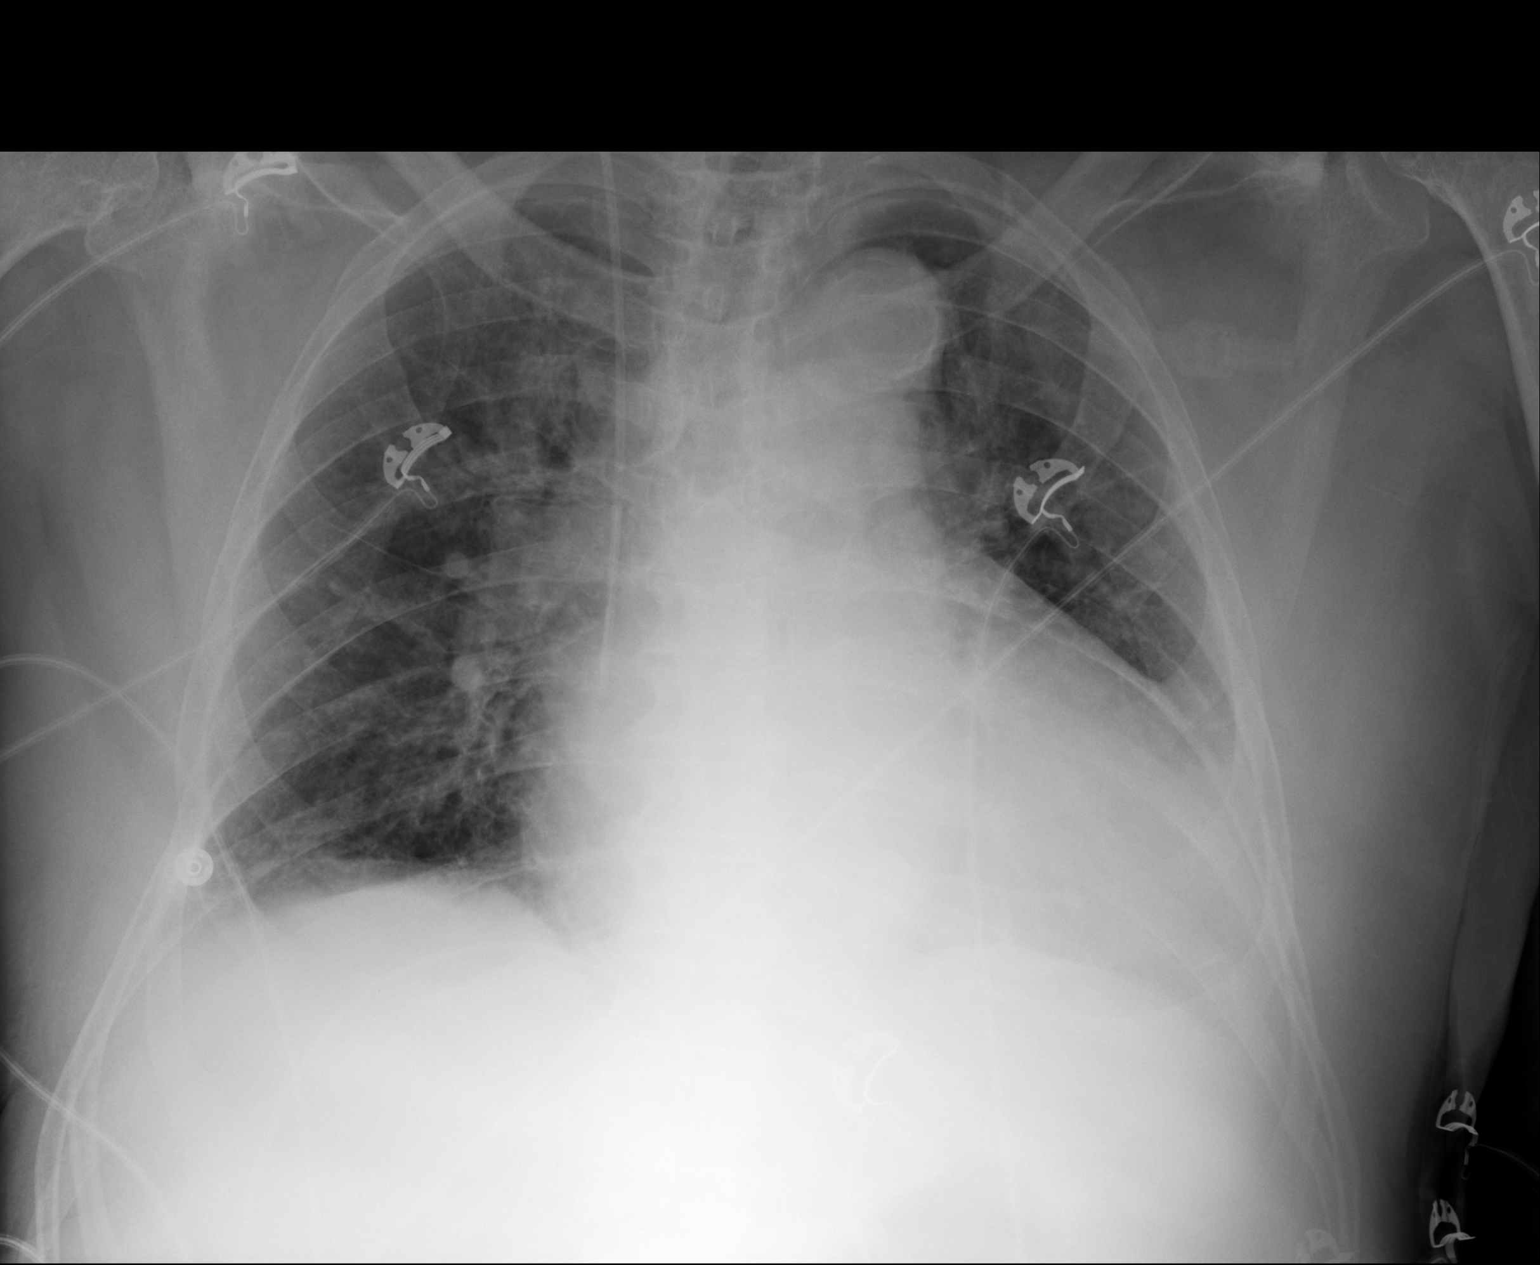

[1 of 1 positions shown; findings below may reference images not displayed]

FINDINGS: Right IJ venous catheter terminates at the cavoatrial junction.

Pulmonary vascular congestion. No frank interstitial edema. No
pleural effusion or pneumothorax.

Cardiomegaly.
IMPRESSION: Right IJ venous catheter terminates at the cavoatrial junction.

## 2016-05-12 IMAGING — CR DG CHEST 1V PORT
1 series · 1 of 1 positions shown · non-contrast
Comparison: 01/03/2014

CLINICAL DATA: Altered mental status.  Increased weakness

EXAM:
PORTABLE CHEST 1 VIEW

[ap]
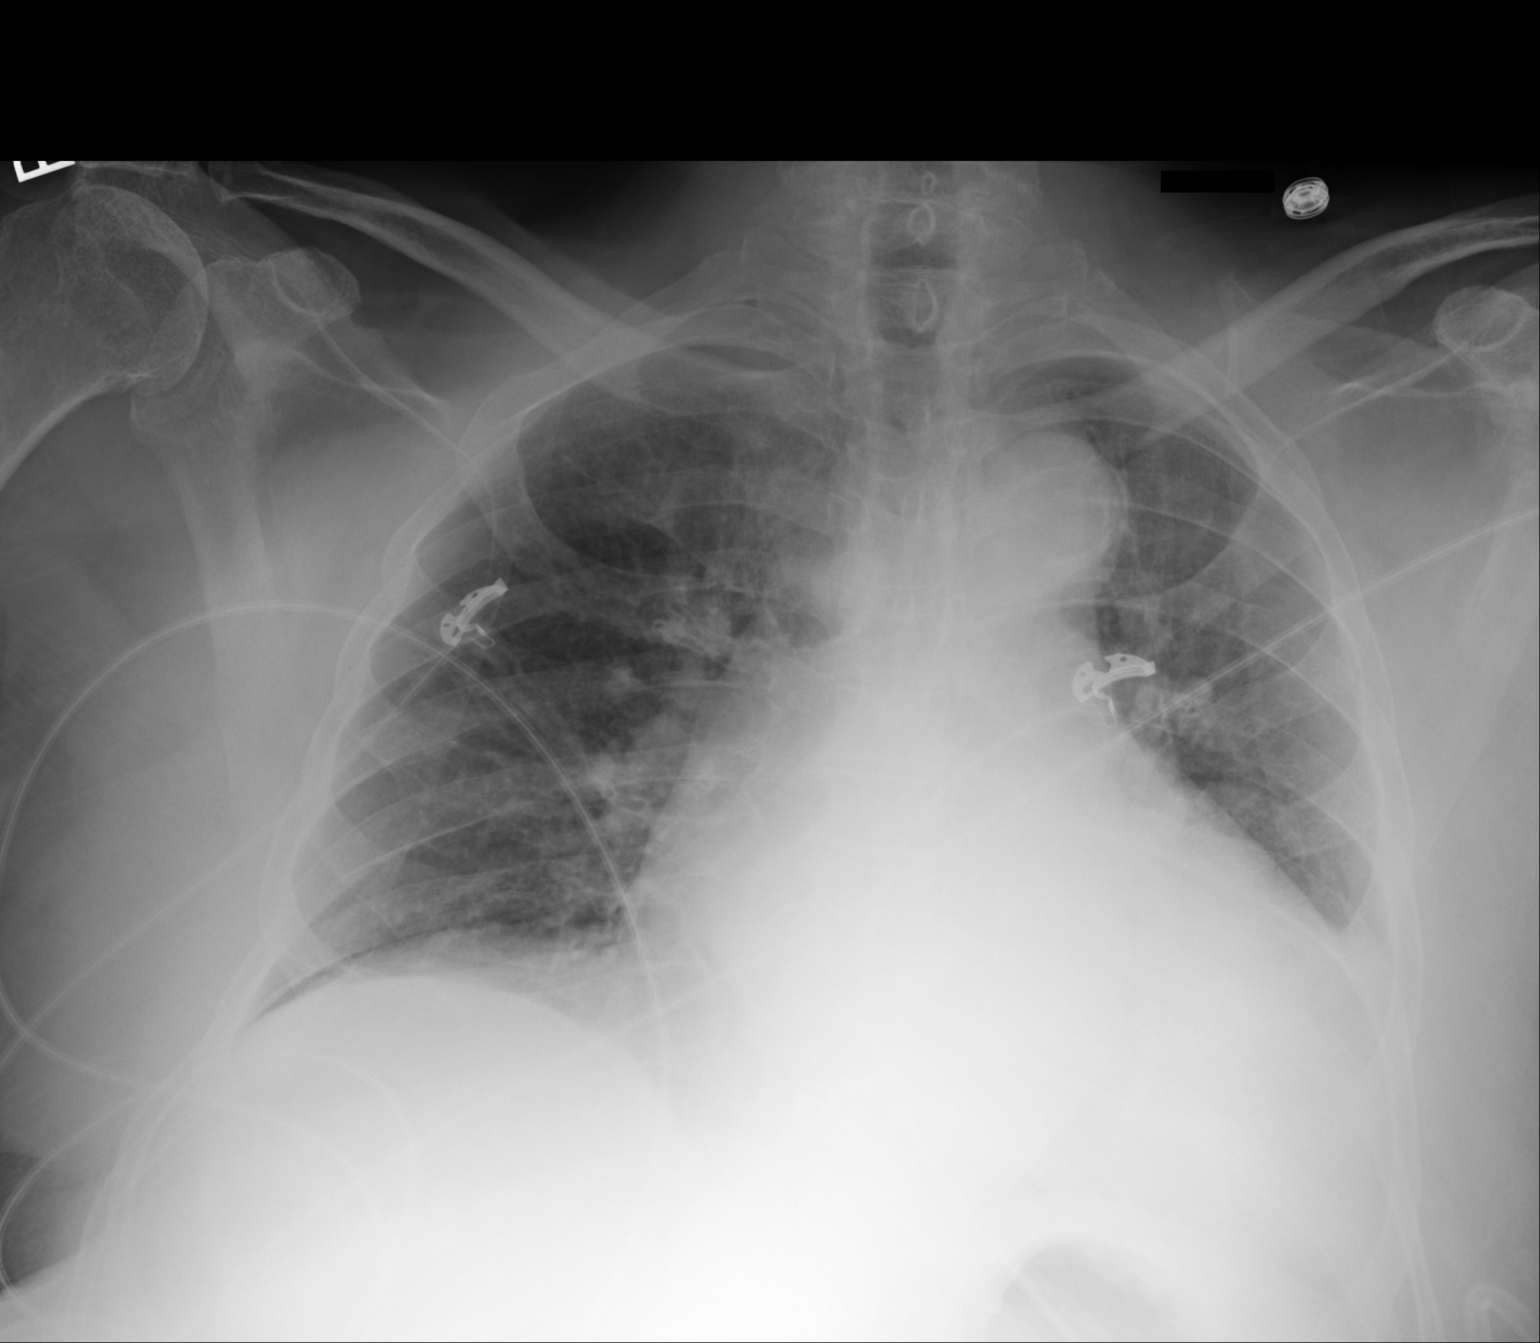

[1 of 1 positions shown; findings below may reference images not displayed]

FINDINGS: Cardiac enlargement with pulmonary vascular congestion suggesting
mild fluid overload. Negative for edema.

Left lower lobe consolidation and small left effusion likely due to
atelectasis. Mild right lower lobe atelectasis
IMPRESSION: Cardiac enlargement with vascular congestion.  Negative for edema

Left lower lobe atelectasis and effusion.

## 2016-05-12 IMAGING — CT CT HEAD W/O CM
2 of 3 series · 16 of 30 positions shown, 18 images · non-contrast
Comparison: 01/03/2014

CLINICAL DATA: Pt here via [facility] from [REDACTED] Pt with
increased weakness and AMS today - noticed and brought to [facility] staff
attention by his spouse Pt is a pt at [REDACTED] - [facility] Suprapubic
cath in place EMS reports a suspected UTI

EXAM:
CT HEAD WITHOUT CONTRAST
TECHNIQUE: Contiguous axial images were obtained from the base of the skull
through the vertex without intravenous contrast.

[Series 2: head wo · axial · 0.43mm/px · z∈[-137,-22]mm · 8 of 31 slices shown, 10 images]
[im 4/31  brain]
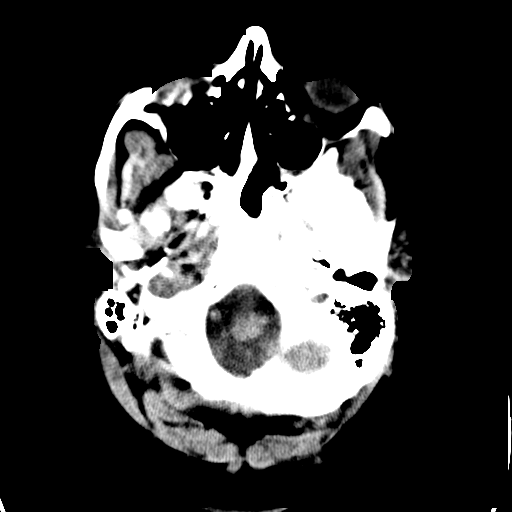
[im 4/31  bone]
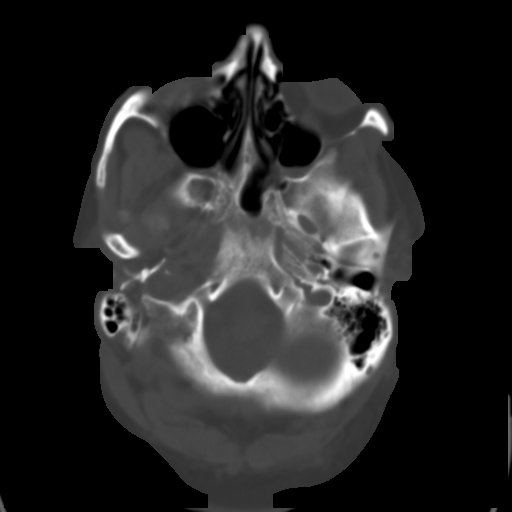
[im 7/31  brain]
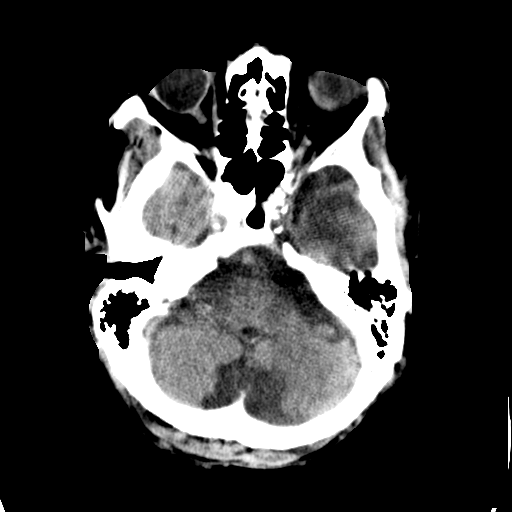
[im 11/31  brain]
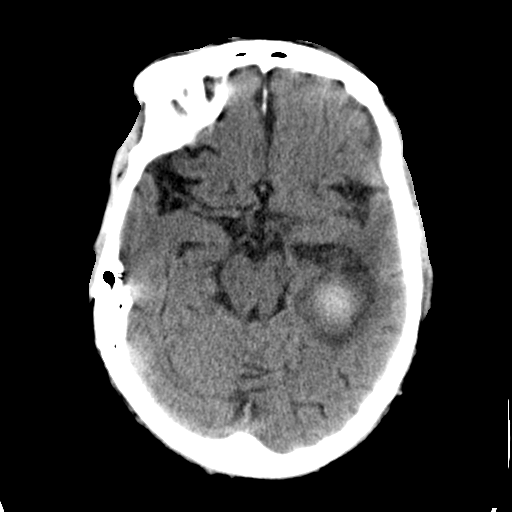
[im 14/31  brain]
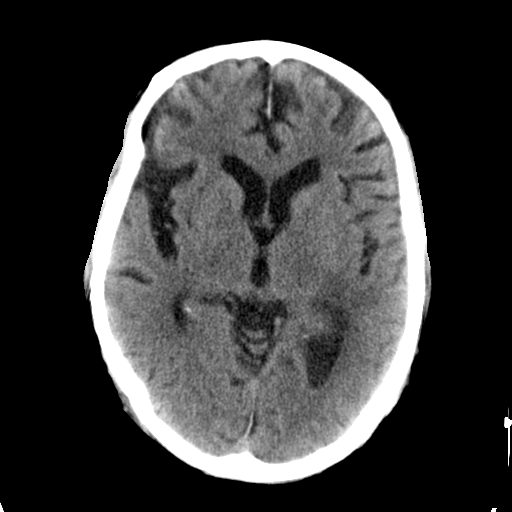
[im 17/31  brain]
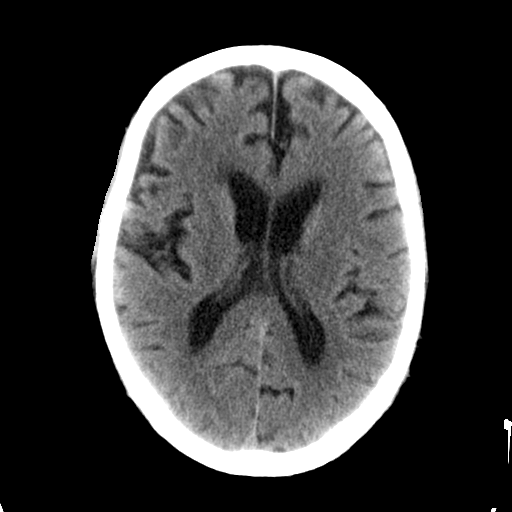
[im 17/31  bone]
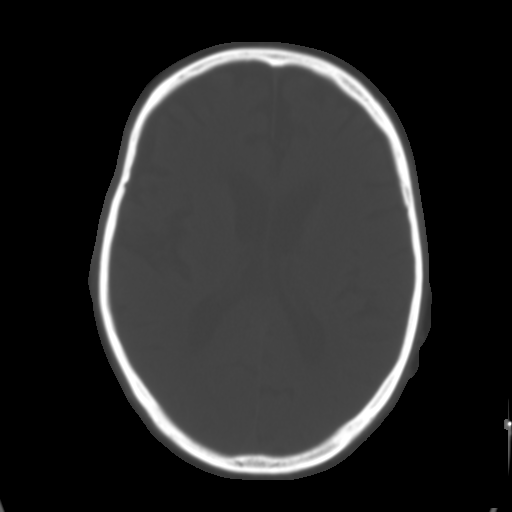
[im 21/31  brain]
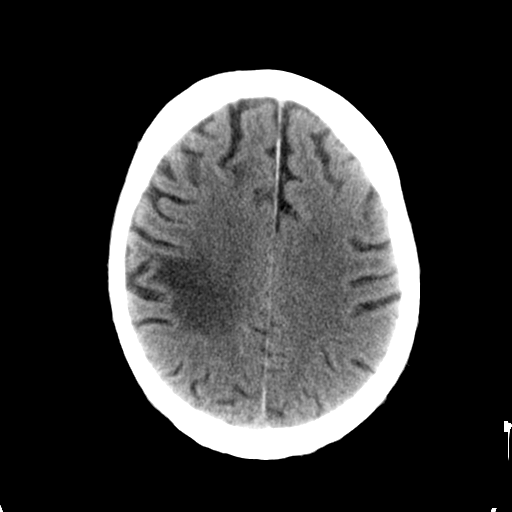
[im 24/31  brain]
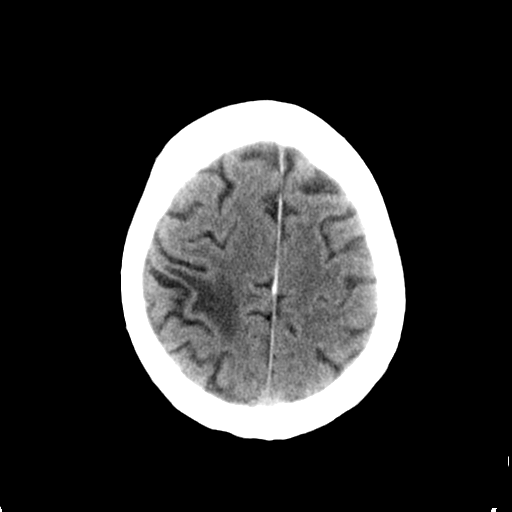
[im 27/31  brain]
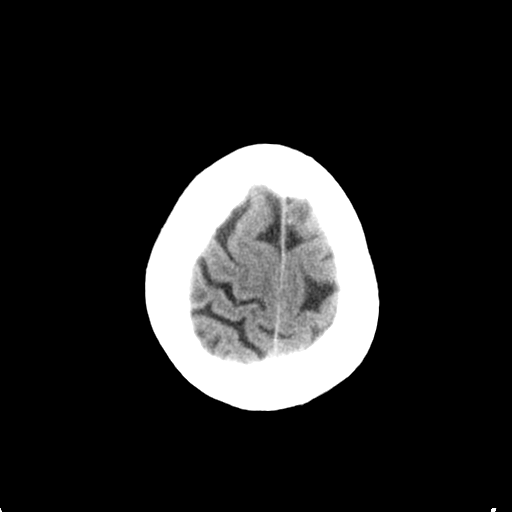

[Series 4: head wo recon · axial · 0.43mm/px · z∈[-90,+18]mm · 8 of 30 slices shown]
[im 4/30  brain]
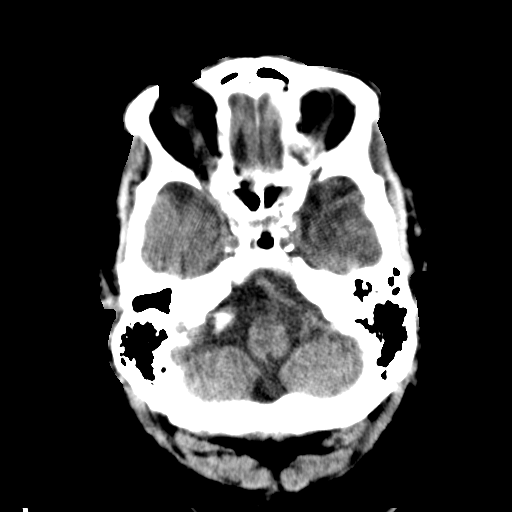
[im 7/30  brain]
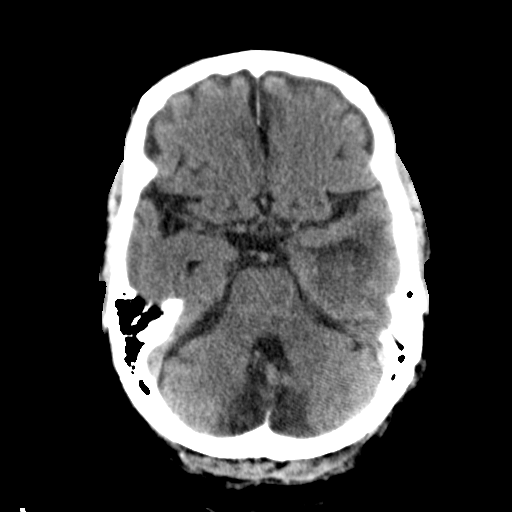
[im 10/30  brain]
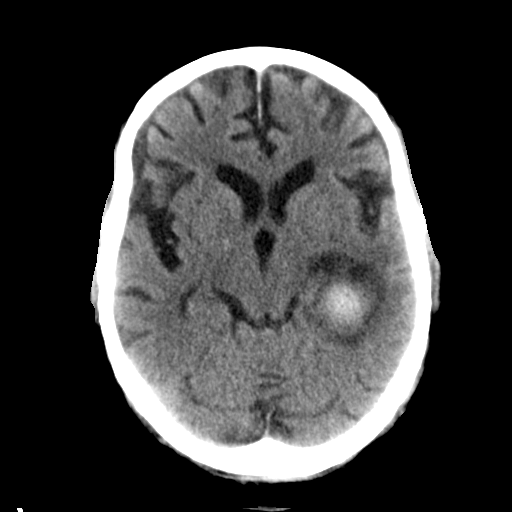
[im 13/30  brain]
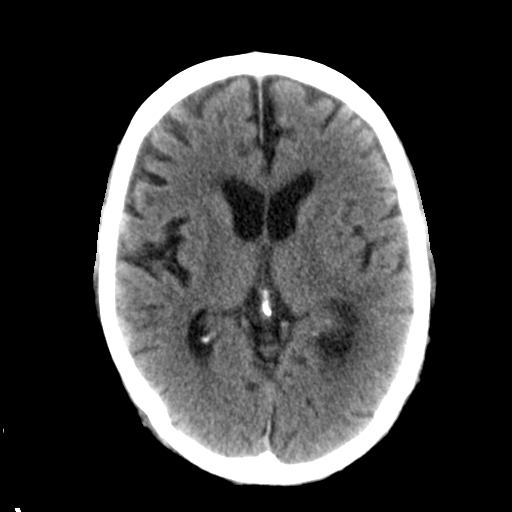
[im 17/30  brain]
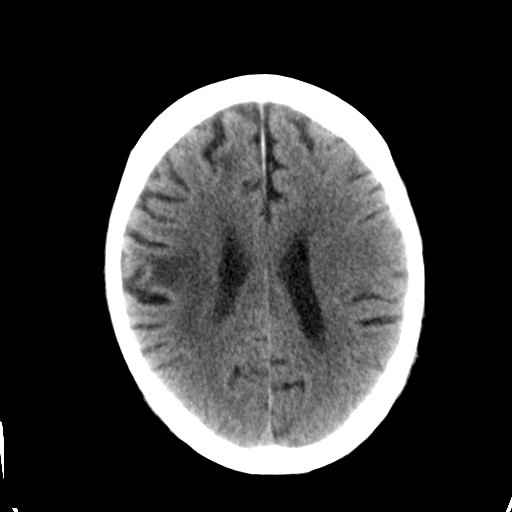
[im 20/30  brain]
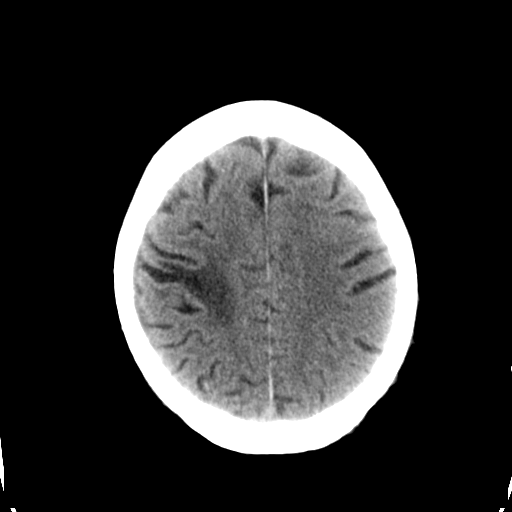
[im 23/30  brain]
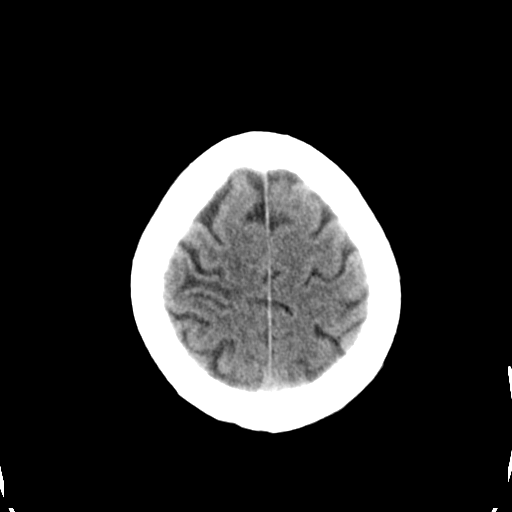
[im 26/30  brain]
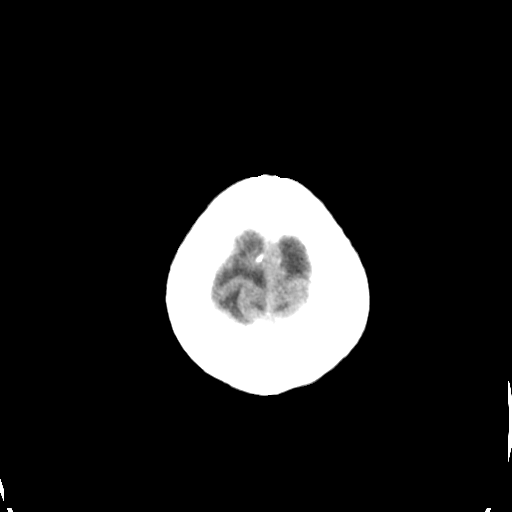

[16 of 30 positions shown; findings below may reference images not displayed]

FINDINGS: Atherosclerotic and physiologic intracranial calcifications. 2.7 cm
hyperdense subacute hemorrhage in the region of the left
hippocampus. There is dilatation of the temporal horn left lateral
ventricle. There is regional adjacent parenchymal edema and
hypoattenuation with effacement of regional sulci. There is no
midline shift. No intraventricular blood. No herniation. Old right
parietal infarct with regional encephalomalacia, more extensive than
was seen on prior CT of 01/03/2014. Moderate diffuse parenchymal
atrophy with prominence of ventricles and sulci elsewhere. Acute
infarct may be inapparent on noncontrast CT. Bone windows reveal no
calvarial lesion.
IMPRESSION: 1. Left subacute intraparenchymal hemorrhage which may originate
from the hippocampus, subependymal, or choroidal. Findings were
reviewed with Dr. Padam, who concurs.
2. No midline shift, Herniation, hydrocephalus, or other
complicating features.

Critical Value/emergent results were discussed by telephone at the
time of interpretation on 02/09/2015 at [DATE] with Dr. JEURY CI
, who verbally acknowledged these results.

3. Old right parietal infarct, more extensive than was seen on most
recent previous study of 01/03/2014.
